# Patient Record
Sex: Female | Born: 1945
Health system: Southern US, Community
[De-identification: ages and names within clinical notes are randomized; demographics above are authoritative.]

## PROBLEM LIST (undated history)

## (undated) DIAGNOSIS — E785 Hyperlipidemia, unspecified: Secondary | ICD-10-CM

## (undated) DIAGNOSIS — I10 Essential (primary) hypertension: Secondary | ICD-10-CM

## (undated) DIAGNOSIS — G459 Transient cerebral ischemic attack, unspecified: Secondary | ICD-10-CM

## (undated) DIAGNOSIS — I2510 Atherosclerotic heart disease of native coronary artery without angina pectoris: Secondary | ICD-10-CM

## (undated) HISTORY — DX: Atherosclerotic heart disease of native coronary artery without angina pectoris: I25.10

## (undated) HISTORY — DX: Hyperlipidemia, unspecified: E78.5

## (undated) HISTORY — DX: Essential (primary) hypertension: I10

## (undated) HISTORY — PX: ABDOMINAL SURGERY: SHX537

## (undated) HISTORY — PX: BREAST CYST ASPIRATION: SHX578

## (undated) HISTORY — DX: Transient cerebral ischemic attack, unspecified: G45.9

---

## 1998-12-01 ENCOUNTER — Encounter: Payer: Self-pay | Admitting: Gynecology

## 1998-12-03 ENCOUNTER — Inpatient Hospital Stay (HOSPITAL_COMMUNITY): Admission: RE | Admit: 1998-12-03 | Discharge: 1998-12-05 | Payer: Self-pay | Admitting: Gynecology

## 1999-08-16 ENCOUNTER — Encounter: Payer: Self-pay | Admitting: Gynecology

## 1999-08-16 ENCOUNTER — Encounter: Admission: RE | Admit: 1999-08-16 | Discharge: 1999-08-16 | Payer: Self-pay | Admitting: Gynecology

## 2000-10-18 ENCOUNTER — Other Ambulatory Visit: Admission: RE | Admit: 2000-10-18 | Discharge: 2000-10-18 | Payer: Self-pay | Admitting: Gynecology

## 2001-01-02 ENCOUNTER — Encounter: Payer: Self-pay | Admitting: Gynecology

## 2001-01-02 ENCOUNTER — Encounter: Admission: RE | Admit: 2001-01-02 | Discharge: 2001-01-02 | Payer: Self-pay | Admitting: Gynecology

## 2001-11-12 ENCOUNTER — Other Ambulatory Visit: Admission: RE | Admit: 2001-11-12 | Discharge: 2001-11-12 | Payer: Self-pay | Admitting: Gynecology

## 2002-12-17 ENCOUNTER — Other Ambulatory Visit: Admission: RE | Admit: 2002-12-17 | Discharge: 2002-12-17 | Payer: Self-pay | Admitting: Gynecology

## 2003-01-02 ENCOUNTER — Encounter: Admission: RE | Admit: 2003-01-02 | Discharge: 2003-01-02 | Payer: Self-pay | Admitting: Gynecology

## 2003-01-02 ENCOUNTER — Encounter: Payer: Self-pay | Admitting: Gynecology

## 2003-12-19 ENCOUNTER — Other Ambulatory Visit: Admission: RE | Admit: 2003-12-19 | Discharge: 2003-12-19 | Payer: Self-pay | Admitting: Gynecology

## 2004-01-05 ENCOUNTER — Encounter: Admission: RE | Admit: 2004-01-05 | Discharge: 2004-01-05 | Payer: Self-pay | Admitting: Gynecology

## 2005-01-05 ENCOUNTER — Encounter: Admission: RE | Admit: 2005-01-05 | Discharge: 2005-01-05 | Payer: Self-pay | Admitting: Gynecology

## 2005-04-22 ENCOUNTER — Other Ambulatory Visit: Admission: RE | Admit: 2005-04-22 | Discharge: 2005-04-22 | Payer: Self-pay | Admitting: Gynecology

## 2005-07-26 ENCOUNTER — Encounter: Admission: RE | Admit: 2005-07-26 | Discharge: 2005-07-26 | Payer: Self-pay | Admitting: Neurology

## 2006-01-09 ENCOUNTER — Encounter: Admission: RE | Admit: 2006-01-09 | Discharge: 2006-01-09 | Payer: Self-pay | Admitting: Gynecology

## 2007-01-15 ENCOUNTER — Encounter: Admission: RE | Admit: 2007-01-15 | Discharge: 2007-01-15 | Payer: Self-pay | Admitting: *Deleted

## 2007-05-09 ENCOUNTER — Other Ambulatory Visit: Admission: RE | Admit: 2007-05-09 | Discharge: 2007-05-09 | Payer: Self-pay | Admitting: Gynecology

## 2008-01-16 ENCOUNTER — Encounter: Admission: RE | Admit: 2008-01-16 | Discharge: 2008-01-16 | Payer: Self-pay | Admitting: Family Medicine

## 2009-01-16 ENCOUNTER — Encounter: Admission: RE | Admit: 2009-01-16 | Discharge: 2009-01-16 | Payer: Self-pay | Admitting: Gynecology

## 2009-06-29 ENCOUNTER — Encounter: Admission: RE | Admit: 2009-06-29 | Discharge: 2009-06-29 | Payer: Self-pay | Admitting: Family Medicine

## 2009-08-05 ENCOUNTER — Encounter (INDEPENDENT_AMBULATORY_CARE_PROVIDER_SITE_OTHER): Payer: Self-pay | Admitting: Interventional Radiology

## 2009-08-05 ENCOUNTER — Other Ambulatory Visit: Admission: RE | Admit: 2009-08-05 | Discharge: 2009-08-05 | Payer: Self-pay | Admitting: Interventional Radiology

## 2009-08-05 ENCOUNTER — Encounter: Admission: RE | Admit: 2009-08-05 | Discharge: 2009-08-05 | Payer: Self-pay | Admitting: General Surgery

## 2009-10-02 ENCOUNTER — Encounter (INDEPENDENT_AMBULATORY_CARE_PROVIDER_SITE_OTHER): Payer: Self-pay | Admitting: General Surgery

## 2009-10-02 ENCOUNTER — Ambulatory Visit (HOSPITAL_COMMUNITY): Admission: RE | Admit: 2009-10-02 | Discharge: 2009-10-03 | Payer: Self-pay | Admitting: General Surgery

## 2010-01-18 ENCOUNTER — Encounter: Admission: RE | Admit: 2010-01-18 | Discharge: 2010-01-18 | Payer: Self-pay | Admitting: Gynecology

## 2010-11-01 ENCOUNTER — Other Ambulatory Visit: Payer: Self-pay | Admitting: Gynecology

## 2010-11-23 IMAGING — US US BIOPSY
1 series · 6 of 6 positions shown · non-contrast
Comparison: none

CLINICAL DATA: Palpable right thyroid dominant mass.

ULTRASOUND-GUIDED THYROID ASPIRATION BIOPSY
TECHNIQUE: Survey ultrasound was performed and the dominant lesion
in the midportion of the right lobe was localized.  An appropriate
skin entry site was determined.  Skin was marked, then prepped with
Betadine, draped in usual sterile fashion, and infiltrated locally
with 1% lidocaine.  Under real-time ultrasound guidance, 4  passes
were made into the lesion with 25 gauge needles.  The patient
tolerated procedure well, with no immediate complications.
IMPRESSION
1.  Technically successful ultrasound-guided thyroid aspiration
biopsy

[Series 1: us biopsy · 0.08mm/px · 6 of 6 slices shown]
[im 1/6]
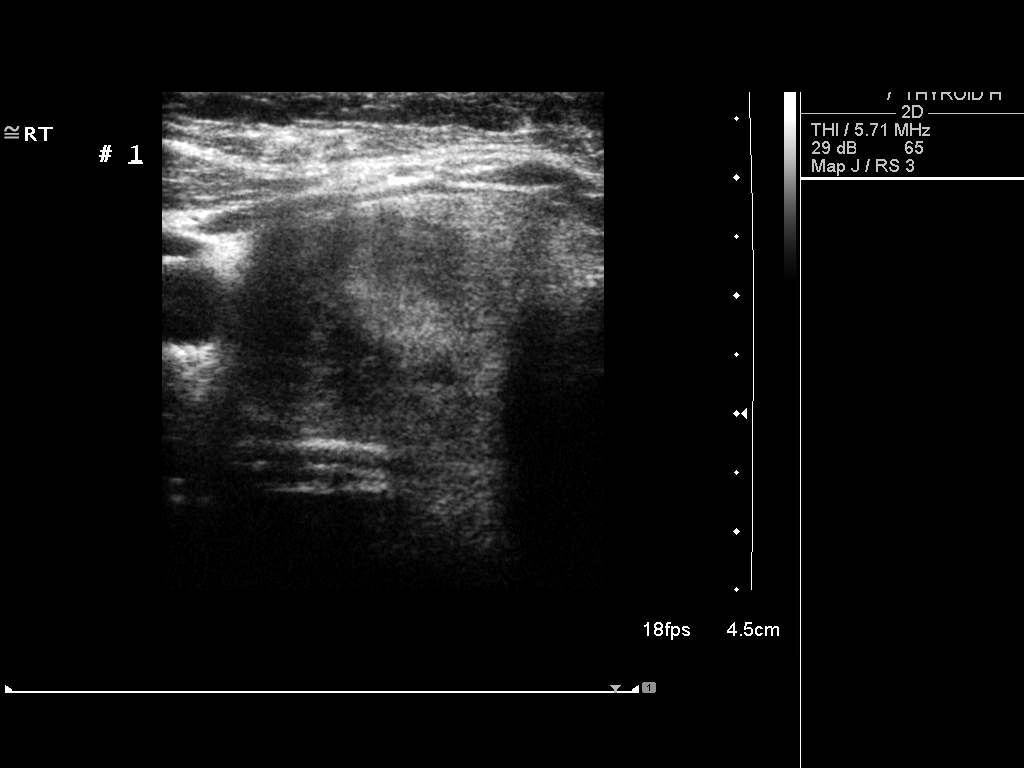
[im 2/6]
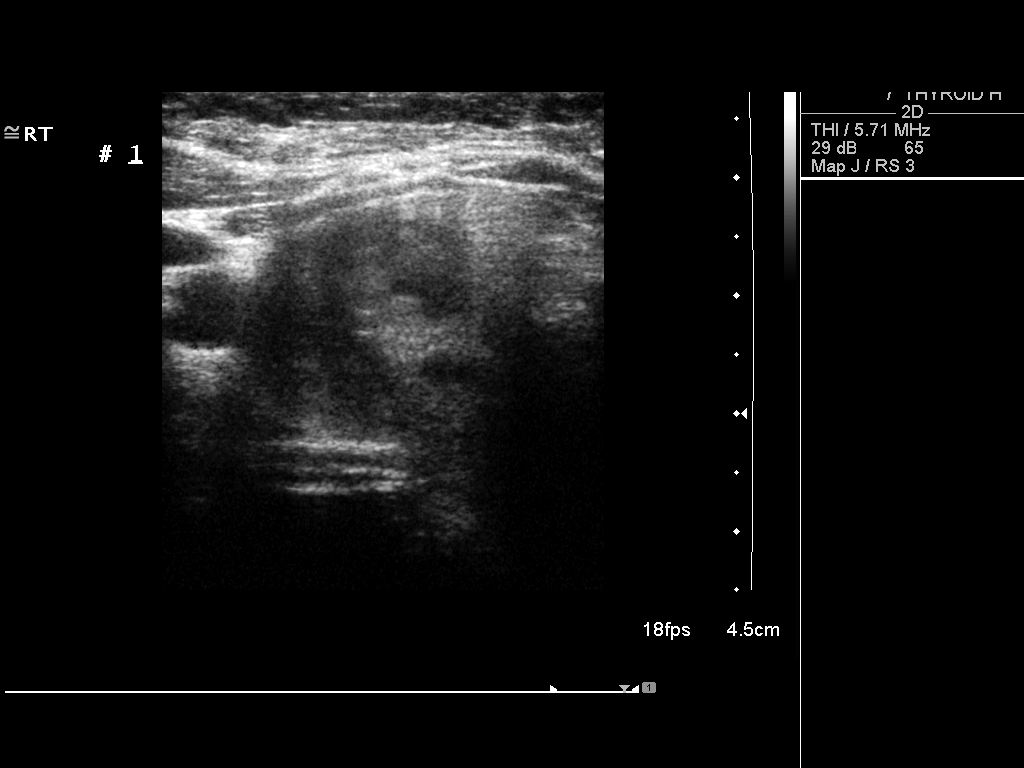
[im 3/6]
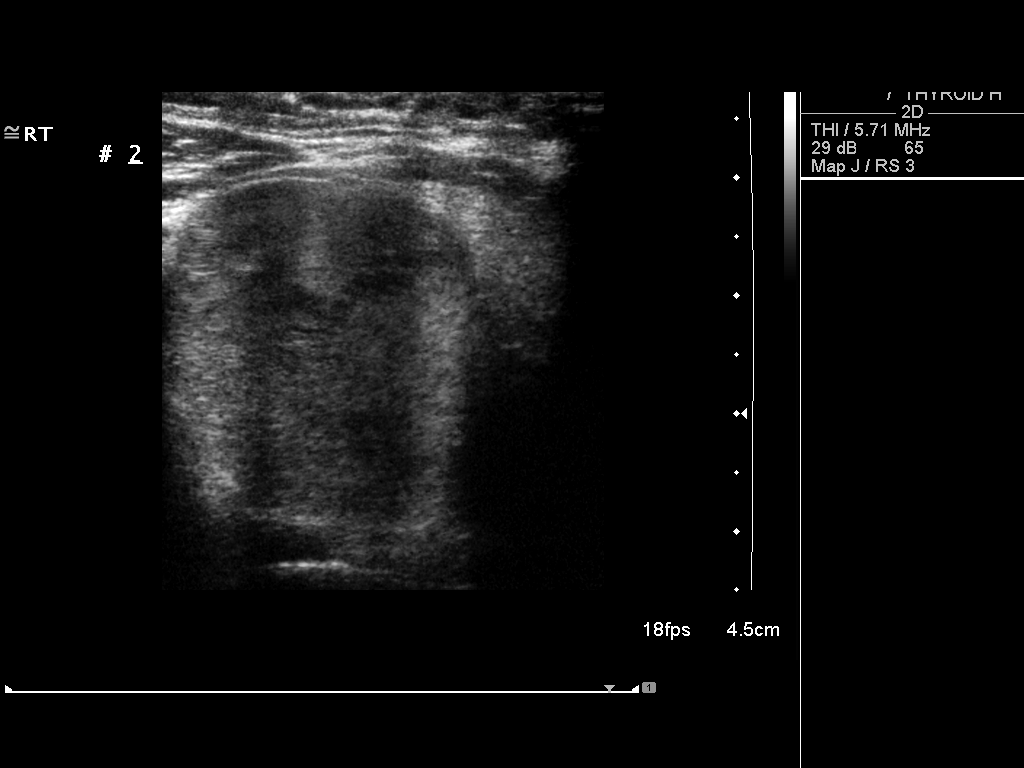
[im 4/6]
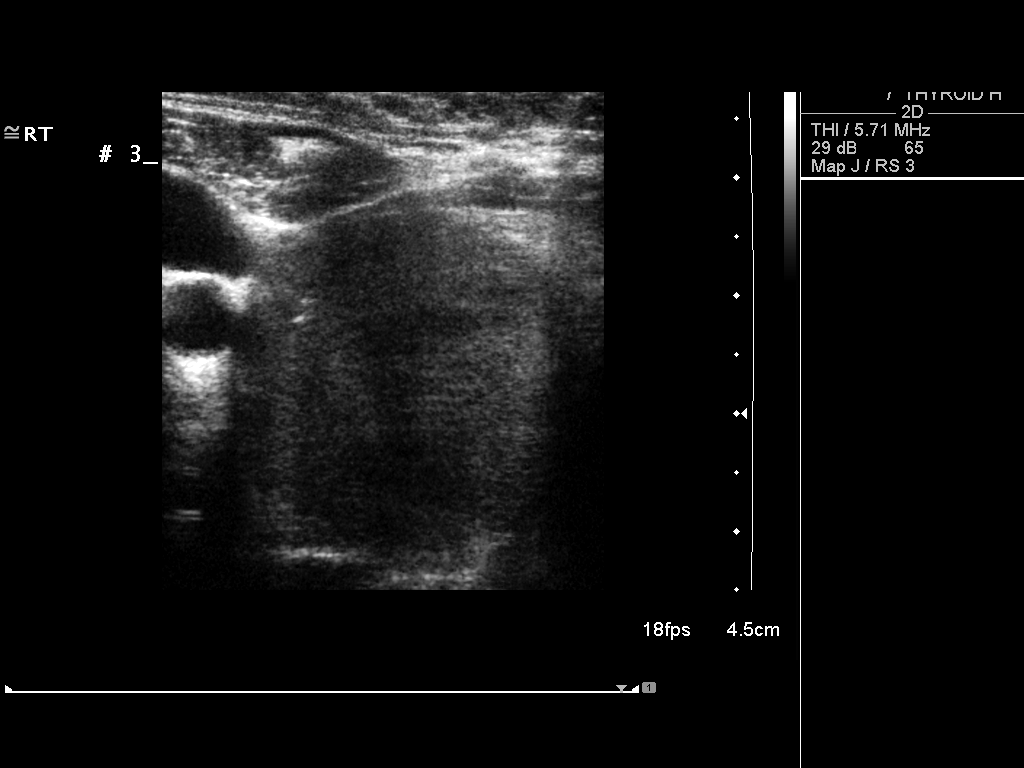
[im 5/6]
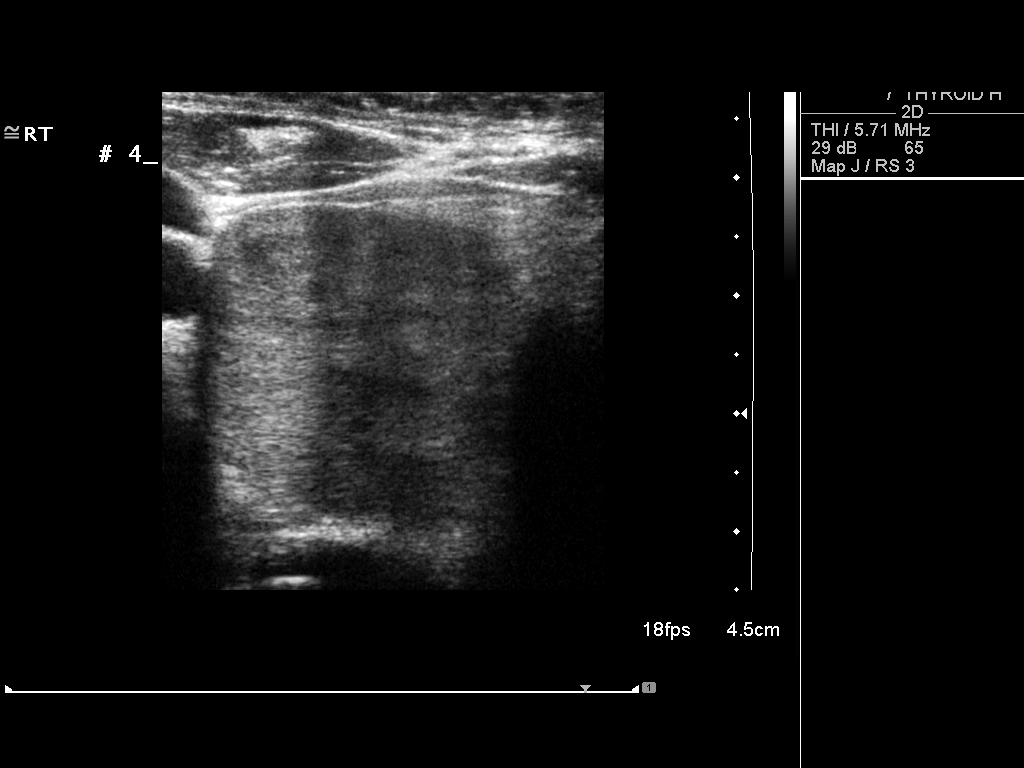
[im 6/6]
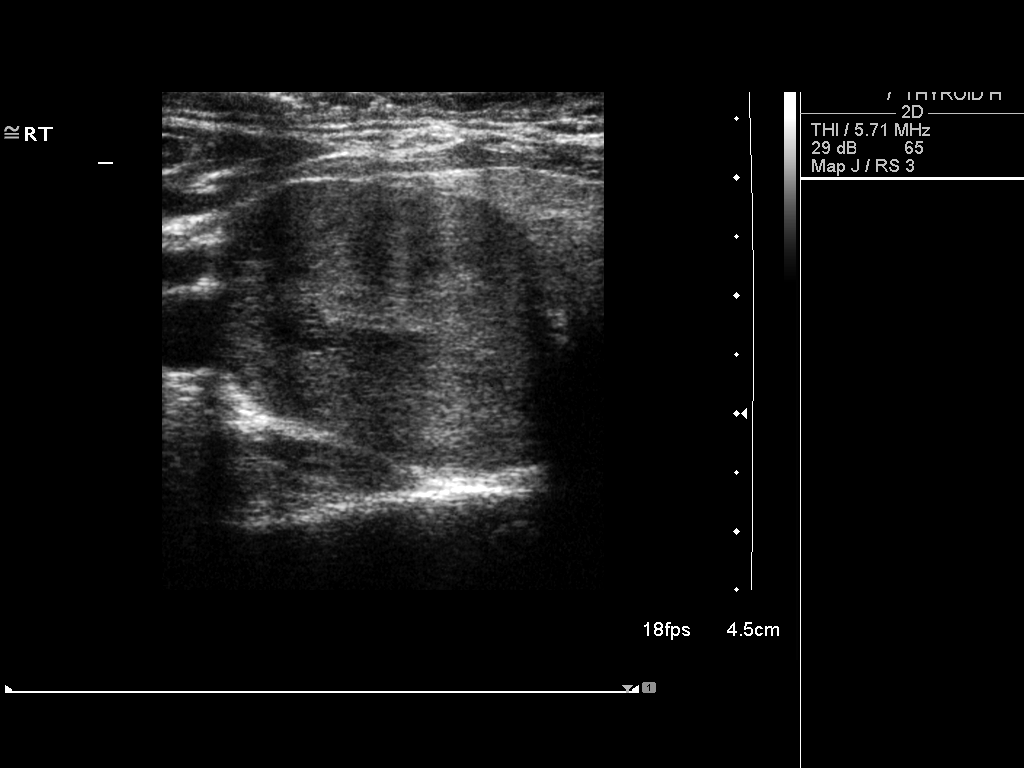

[6 of 6 positions shown; findings below may reference images not displayed]

## 2010-12-21 ENCOUNTER — Other Ambulatory Visit: Payer: Self-pay | Admitting: Family Medicine

## 2010-12-21 DIAGNOSIS — Z1231 Encounter for screening mammogram for malignant neoplasm of breast: Secondary | ICD-10-CM

## 2011-01-18 LAB — URINE MICROSCOPIC-ADD ON

## 2011-01-18 LAB — DIFFERENTIAL
Basophils Absolute: 0 10*3/uL (ref 0.0–0.1)
Basophils Relative: 1 % (ref 0–1)
Eosinophils Absolute: 0.2 10*3/uL (ref 0.0–0.7)
Eosinophils Relative: 4 % (ref 0–5)
Lymphocytes Relative: 36 % (ref 12–46)
Lymphs Abs: 2 10*3/uL (ref 0.7–4.0)
Monocytes Relative: 10 % (ref 3–12)
Neutro Abs: 2.8 10*3/uL (ref 1.7–7.7)

## 2011-01-18 LAB — URINALYSIS, ROUTINE W REFLEX MICROSCOPIC
Ketones, ur: NEGATIVE mg/dL
Protein, ur: NEGATIVE mg/dL

## 2011-01-18 LAB — CBC
RBC: 4.62 MIL/uL (ref 3.87–5.11)
WBC: 5.7 10*3/uL (ref 4.0–10.5)

## 2011-01-18 LAB — COMPREHENSIVE METABOLIC PANEL
Alkaline Phosphatase: 61 U/L (ref 39–117)
BUN: 11 mg/dL (ref 6–23)
Chloride: 105 mEq/L (ref 96–112)
Creatinine, Ser: 0.79 mg/dL (ref 0.4–1.2)
GFR calc Af Amer: >60 mL/min (ref >60)
Glucose, Bld: 97 mg/dL (ref 70–99)
Potassium: 5 mEq/L (ref 3.5–5.1)
Sodium: 141 mEq/L (ref 135–145)

## 2011-01-20 ENCOUNTER — Ambulatory Visit
Admission: RE | Admit: 2011-01-20 | Discharge: 2011-01-20 | Disposition: A | Discharge status: Home / Self Care | Payer: 59 | Source: Ambulatory Visit | Attending: Family Medicine | Admitting: Family Medicine

## 2011-01-20 DIAGNOSIS — Z1231 Encounter for screening mammogram for malignant neoplasm of breast: Secondary | ICD-10-CM

## 2011-02-15 ENCOUNTER — Ambulatory Visit: Payer: 59 | Attending: Family Medicine | Admitting: Physical Therapy

## 2011-02-15 DIAGNOSIS — M25569 Pain in unspecified knee: Secondary | ICD-10-CM | POA: Insufficient documentation

## 2011-02-15 DIAGNOSIS — IMO0001 Reserved for inherently not codable concepts without codable children: Secondary | ICD-10-CM | POA: Insufficient documentation

## 2011-02-15 DIAGNOSIS — M25669 Stiffness of unspecified knee, not elsewhere classified: Secondary | ICD-10-CM | POA: Insufficient documentation

## 2011-03-15 ENCOUNTER — Encounter: Payer: 59 | Admitting: Physical Therapy

## 2011-12-21 ENCOUNTER — Other Ambulatory Visit: Payer: Self-pay | Admitting: Family Medicine

## 2011-12-21 DIAGNOSIS — Z1231 Encounter for screening mammogram for malignant neoplasm of breast: Secondary | ICD-10-CM

## 2012-01-23 ENCOUNTER — Ambulatory Visit
Admission: RE | Admit: 2012-01-23 | Discharge: 2012-01-23 | Disposition: A | Discharge status: Home / Self Care | Payer: 59 | Source: Ambulatory Visit | Attending: Family Medicine | Admitting: Family Medicine

## 2012-01-23 DIAGNOSIS — Z1231 Encounter for screening mammogram for malignant neoplasm of breast: Secondary | ICD-10-CM

## 2012-07-24 ENCOUNTER — Other Ambulatory Visit: Payer: Self-pay | Admitting: Gynecology

## 2012-12-28 DIAGNOSIS — R7301 Impaired fasting glucose: Secondary | ICD-10-CM | POA: Diagnosis not present

## 2012-12-28 DIAGNOSIS — I10 Essential (primary) hypertension: Secondary | ICD-10-CM | POA: Diagnosis not present

## 2012-12-28 DIAGNOSIS — E559 Vitamin D deficiency, unspecified: Secondary | ICD-10-CM | POA: Diagnosis not present

## 2012-12-28 DIAGNOSIS — E039 Hypothyroidism, unspecified: Secondary | ICD-10-CM | POA: Diagnosis not present

## 2012-12-28 DIAGNOSIS — R300 Dysuria: Secondary | ICD-10-CM | POA: Diagnosis not present

## 2012-12-28 DIAGNOSIS — E782 Mixed hyperlipidemia: Secondary | ICD-10-CM | POA: Diagnosis not present

## 2012-12-28 DIAGNOSIS — R7989 Other specified abnormal findings of blood chemistry: Secondary | ICD-10-CM | POA: Diagnosis not present

## 2012-12-28 DIAGNOSIS — N390 Urinary tract infection, site not specified: Secondary | ICD-10-CM | POA: Diagnosis not present

## 2013-01-01 ENCOUNTER — Other Ambulatory Visit: Payer: Self-pay

## 2013-01-01 DIAGNOSIS — Z1231 Encounter for screening mammogram for malignant neoplasm of breast: Secondary | ICD-10-CM

## 2013-01-02 DIAGNOSIS — E039 Hypothyroidism, unspecified: Secondary | ICD-10-CM | POA: Diagnosis not present

## 2013-01-02 DIAGNOSIS — E782 Mixed hyperlipidemia: Secondary | ICD-10-CM | POA: Diagnosis not present

## 2013-01-02 DIAGNOSIS — I10 Essential (primary) hypertension: Secondary | ICD-10-CM | POA: Diagnosis not present

## 2013-01-02 DIAGNOSIS — R7301 Impaired fasting glucose: Secondary | ICD-10-CM | POA: Diagnosis not present

## 2013-01-02 DIAGNOSIS — Z23 Encounter for immunization: Secondary | ICD-10-CM | POA: Diagnosis not present

## 2013-01-02 DIAGNOSIS — E559 Vitamin D deficiency, unspecified: Secondary | ICD-10-CM | POA: Diagnosis not present

## 2013-01-02 DIAGNOSIS — M949 Disorder of cartilage, unspecified: Secondary | ICD-10-CM | POA: Diagnosis not present

## 2013-01-02 DIAGNOSIS — M899 Disorder of bone, unspecified: Secondary | ICD-10-CM | POA: Diagnosis not present

## 2013-02-05 ENCOUNTER — Ambulatory Visit: Payer: 59

## 2013-02-20 ENCOUNTER — Ambulatory Visit
Admission: RE | Admit: 2013-02-20 | Discharge: 2013-02-20 | Disposition: A | Discharge status: Home / Self Care | Payer: Medicare Other | Source: Ambulatory Visit

## 2013-02-20 DIAGNOSIS — Z1231 Encounter for screening mammogram for malignant neoplasm of breast: Secondary | ICD-10-CM

## 2013-07-05 DIAGNOSIS — E039 Hypothyroidism, unspecified: Secondary | ICD-10-CM | POA: Diagnosis not present

## 2013-07-05 DIAGNOSIS — M899 Disorder of bone, unspecified: Secondary | ICD-10-CM | POA: Diagnosis not present

## 2013-07-05 DIAGNOSIS — E559 Vitamin D deficiency, unspecified: Secondary | ICD-10-CM | POA: Diagnosis not present

## 2013-07-05 DIAGNOSIS — I10 Essential (primary) hypertension: Secondary | ICD-10-CM | POA: Diagnosis not present

## 2013-07-05 DIAGNOSIS — R7301 Impaired fasting glucose: Secondary | ICD-10-CM | POA: Diagnosis not present

## 2013-07-05 DIAGNOSIS — E782 Mixed hyperlipidemia: Secondary | ICD-10-CM | POA: Diagnosis not present

## 2013-07-09 DIAGNOSIS — I10 Essential (primary) hypertension: Secondary | ICD-10-CM | POA: Diagnosis not present

## 2013-07-09 DIAGNOSIS — R7301 Impaired fasting glucose: Secondary | ICD-10-CM | POA: Diagnosis not present

## 2013-07-09 DIAGNOSIS — E559 Vitamin D deficiency, unspecified: Secondary | ICD-10-CM | POA: Diagnosis not present

## 2013-07-09 DIAGNOSIS — Z0000 Encounter for general adult medical examination without abnormal findings: Secondary | ICD-10-CM | POA: Diagnosis not present

## 2013-07-09 DIAGNOSIS — N952 Postmenopausal atrophic vaginitis: Secondary | ICD-10-CM | POA: Diagnosis not present

## 2013-07-09 DIAGNOSIS — E782 Mixed hyperlipidemia: Secondary | ICD-10-CM | POA: Diagnosis not present

## 2013-07-09 DIAGNOSIS — E039 Hypothyroidism, unspecified: Secondary | ICD-10-CM | POA: Diagnosis not present

## 2013-07-09 DIAGNOSIS — M899 Disorder of bone, unspecified: Secondary | ICD-10-CM | POA: Diagnosis not present

## 2013-07-13 DIAGNOSIS — F05 Delirium due to known physiological condition: Secondary | ICD-10-CM | POA: Diagnosis not present

## 2013-07-16 ENCOUNTER — Other Ambulatory Visit: Payer: Self-pay | Admitting: Family Medicine

## 2013-07-16 DIAGNOSIS — G459 Transient cerebral ischemic attack, unspecified: Secondary | ICD-10-CM

## 2013-07-17 ENCOUNTER — Ambulatory Visit
Admission: RE | Admit: 2013-07-17 | Discharge: 2013-07-17 | Disposition: A | Discharge status: Home / Self Care | Payer: TRICARE For Life (TFL) | Source: Ambulatory Visit | Attending: Family Medicine | Admitting: Family Medicine

## 2013-07-17 DIAGNOSIS — G459 Transient cerebral ischemic attack, unspecified: Secondary | ICD-10-CM

## 2013-07-17 DIAGNOSIS — R0989 Other specified symptoms and signs involving the circulatory and respiratory systems: Secondary | ICD-10-CM | POA: Diagnosis not present

## 2013-07-26 ENCOUNTER — Other Ambulatory Visit: Payer: Self-pay | Admitting: Family Medicine

## 2013-07-26 DIAGNOSIS — L820 Inflamed seborrheic keratosis: Secondary | ICD-10-CM | POA: Diagnosis not present

## 2013-07-26 DIAGNOSIS — G459 Transient cerebral ischemic attack, unspecified: Secondary | ICD-10-CM | POA: Diagnosis not present

## 2013-07-30 DIAGNOSIS — M899 Disorder of bone, unspecified: Secondary | ICD-10-CM | POA: Diagnosis not present

## 2013-08-14 ENCOUNTER — Other Ambulatory Visit (HOSPITAL_COMMUNITY): Payer: Medicare Other

## 2013-08-20 ENCOUNTER — Other Ambulatory Visit (HOSPITAL_COMMUNITY): Payer: Self-pay | Admitting: Family Medicine

## 2013-08-20 ENCOUNTER — Ambulatory Visit (HOSPITAL_COMMUNITY): Payer: Medicare Other | Attending: Cardiology

## 2013-08-20 ENCOUNTER — Encounter (INDEPENDENT_AMBULATORY_CARE_PROVIDER_SITE_OTHER): Payer: Self-pay

## 2013-08-20 DIAGNOSIS — G459 Transient cerebral ischemic attack, unspecified: Secondary | ICD-10-CM | POA: Insufficient documentation

## 2013-08-20 DIAGNOSIS — I079 Rheumatic tricuspid valve disease, unspecified: Secondary | ICD-10-CM | POA: Diagnosis not present

## 2013-08-20 DIAGNOSIS — I059 Rheumatic mitral valve disease, unspecified: Secondary | ICD-10-CM | POA: Insufficient documentation

## 2013-08-20 DIAGNOSIS — I359 Nonrheumatic aortic valve disorder, unspecified: Secondary | ICD-10-CM | POA: Insufficient documentation

## 2013-08-20 NOTE — Progress Notes (Signed)
Echocardiogram performed.  

## 2013-09-16 DIAGNOSIS — E782 Mixed hyperlipidemia: Secondary | ICD-10-CM | POA: Diagnosis not present

## 2013-09-16 DIAGNOSIS — R7301 Impaired fasting glucose: Secondary | ICD-10-CM | POA: Diagnosis not present

## 2013-09-16 DIAGNOSIS — E559 Vitamin D deficiency, unspecified: Secondary | ICD-10-CM | POA: Diagnosis not present

## 2013-09-16 DIAGNOSIS — M899 Disorder of bone, unspecified: Secondary | ICD-10-CM | POA: Diagnosis not present

## 2013-09-16 DIAGNOSIS — E049 Nontoxic goiter, unspecified: Secondary | ICD-10-CM | POA: Diagnosis not present

## 2013-09-16 DIAGNOSIS — E039 Hypothyroidism, unspecified: Secondary | ICD-10-CM | POA: Diagnosis not present

## 2013-09-30 DIAGNOSIS — I10 Essential (primary) hypertension: Secondary | ICD-10-CM | POA: Diagnosis not present

## 2013-09-30 DIAGNOSIS — E782 Mixed hyperlipidemia: Secondary | ICD-10-CM | POA: Diagnosis not present

## 2013-09-30 DIAGNOSIS — E039 Hypothyroidism, unspecified: Secondary | ICD-10-CM | POA: Diagnosis not present

## 2013-09-30 DIAGNOSIS — G459 Transient cerebral ischemic attack, unspecified: Secondary | ICD-10-CM | POA: Diagnosis not present

## 2014-01-13 DIAGNOSIS — Z0000 Encounter for general adult medical examination without abnormal findings: Secondary | ICD-10-CM | POA: Diagnosis not present

## 2014-01-13 DIAGNOSIS — E039 Hypothyroidism, unspecified: Secondary | ICD-10-CM | POA: Diagnosis not present

## 2014-01-13 DIAGNOSIS — E782 Mixed hyperlipidemia: Secondary | ICD-10-CM | POA: Diagnosis not present

## 2014-01-13 DIAGNOSIS — E559 Vitamin D deficiency, unspecified: Secondary | ICD-10-CM | POA: Diagnosis not present

## 2014-01-13 DIAGNOSIS — I10 Essential (primary) hypertension: Secondary | ICD-10-CM | POA: Diagnosis not present

## 2014-01-13 DIAGNOSIS — G459 Transient cerebral ischemic attack, unspecified: Secondary | ICD-10-CM | POA: Diagnosis not present

## 2014-01-13 DIAGNOSIS — E049 Nontoxic goiter, unspecified: Secondary | ICD-10-CM | POA: Diagnosis not present

## 2014-01-13 DIAGNOSIS — D485 Neoplasm of uncertain behavior of skin: Secondary | ICD-10-CM | POA: Diagnosis not present

## 2014-01-13 DIAGNOSIS — R7301 Impaired fasting glucose: Secondary | ICD-10-CM | POA: Diagnosis not present

## 2014-01-15 DIAGNOSIS — E039 Hypothyroidism, unspecified: Secondary | ICD-10-CM | POA: Diagnosis not present

## 2014-01-15 DIAGNOSIS — M719 Bursopathy, unspecified: Secondary | ICD-10-CM | POA: Diagnosis not present

## 2014-01-15 DIAGNOSIS — E782 Mixed hyperlipidemia: Secondary | ICD-10-CM | POA: Diagnosis not present

## 2014-01-15 DIAGNOSIS — G459 Transient cerebral ischemic attack, unspecified: Secondary | ICD-10-CM | POA: Diagnosis not present

## 2014-01-15 DIAGNOSIS — N952 Postmenopausal atrophic vaginitis: Secondary | ICD-10-CM | POA: Diagnosis not present

## 2014-01-15 DIAGNOSIS — R7301 Impaired fasting glucose: Secondary | ICD-10-CM | POA: Diagnosis not present

## 2014-01-15 DIAGNOSIS — I10 Essential (primary) hypertension: Secondary | ICD-10-CM | POA: Diagnosis not present

## 2014-01-15 DIAGNOSIS — M67919 Unspecified disorder of synovium and tendon, unspecified shoulder: Secondary | ICD-10-CM | POA: Diagnosis not present

## 2014-01-15 DIAGNOSIS — E559 Vitamin D deficiency, unspecified: Secondary | ICD-10-CM | POA: Diagnosis not present

## 2014-01-15 DIAGNOSIS — Z23 Encounter for immunization: Secondary | ICD-10-CM | POA: Diagnosis not present

## 2014-01-16 ENCOUNTER — Ambulatory Visit: Payer: Medicare Other | Attending: Family Medicine

## 2014-01-16 DIAGNOSIS — R5381 Other malaise: Secondary | ICD-10-CM | POA: Diagnosis not present

## 2014-01-16 DIAGNOSIS — M25619 Stiffness of unspecified shoulder, not elsewhere classified: Secondary | ICD-10-CM | POA: Insufficient documentation

## 2014-01-21 ENCOUNTER — Other Ambulatory Visit: Payer: Self-pay

## 2014-01-21 DIAGNOSIS — Z1231 Encounter for screening mammogram for malignant neoplasm of breast: Secondary | ICD-10-CM

## 2014-01-22 ENCOUNTER — Ambulatory Visit: Payer: Medicare Other

## 2014-01-23 ENCOUNTER — Ambulatory Visit: Payer: Medicare Other

## 2014-01-27 ENCOUNTER — Ambulatory Visit: Payer: Medicare Other

## 2014-01-29 ENCOUNTER — Ambulatory Visit: Payer: Medicare Other

## 2014-03-24 ENCOUNTER — Ambulatory Visit
Admission: RE | Admit: 2014-03-24 | Discharge: 2014-03-24 | Disposition: A | Discharge status: Home / Self Care | Payer: Medicare Other | Source: Ambulatory Visit

## 2014-03-24 DIAGNOSIS — Z1231 Encounter for screening mammogram for malignant neoplasm of breast: Secondary | ICD-10-CM | POA: Diagnosis not present

## 2014-03-24 IMAGING — MG MM SCREEN MAMMOGRAM BILATERAL
4 series · 4 of 4 positions shown · non-contrast
Comparison: Previous exam(s).

CLINICAL DATA: Screening.

EXAM:
DIGITAL SCREENING BILATERAL MAMMOGRAM WITH CAD

[R CC]
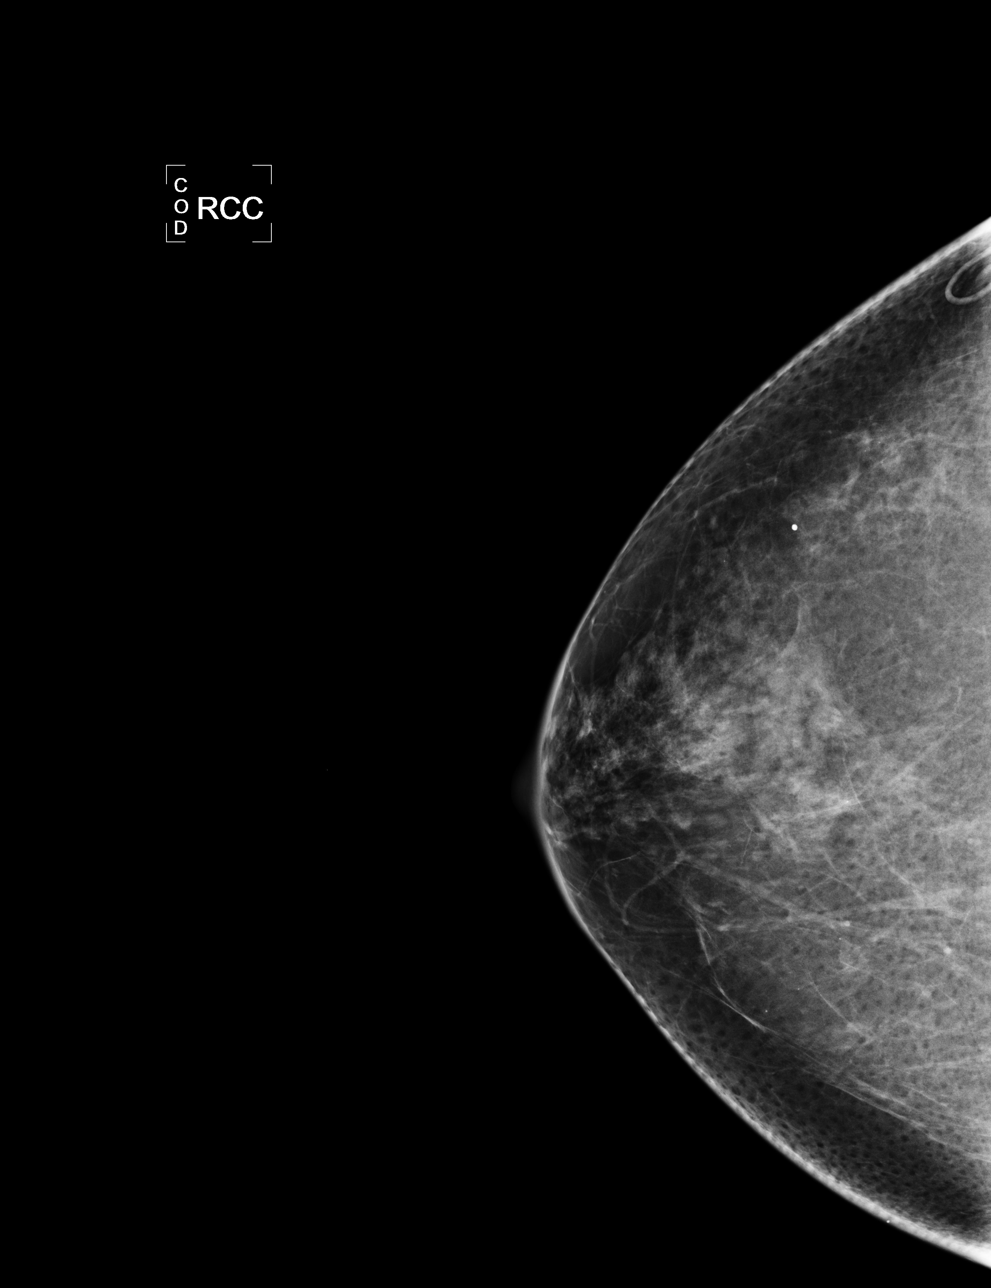

[L CC]
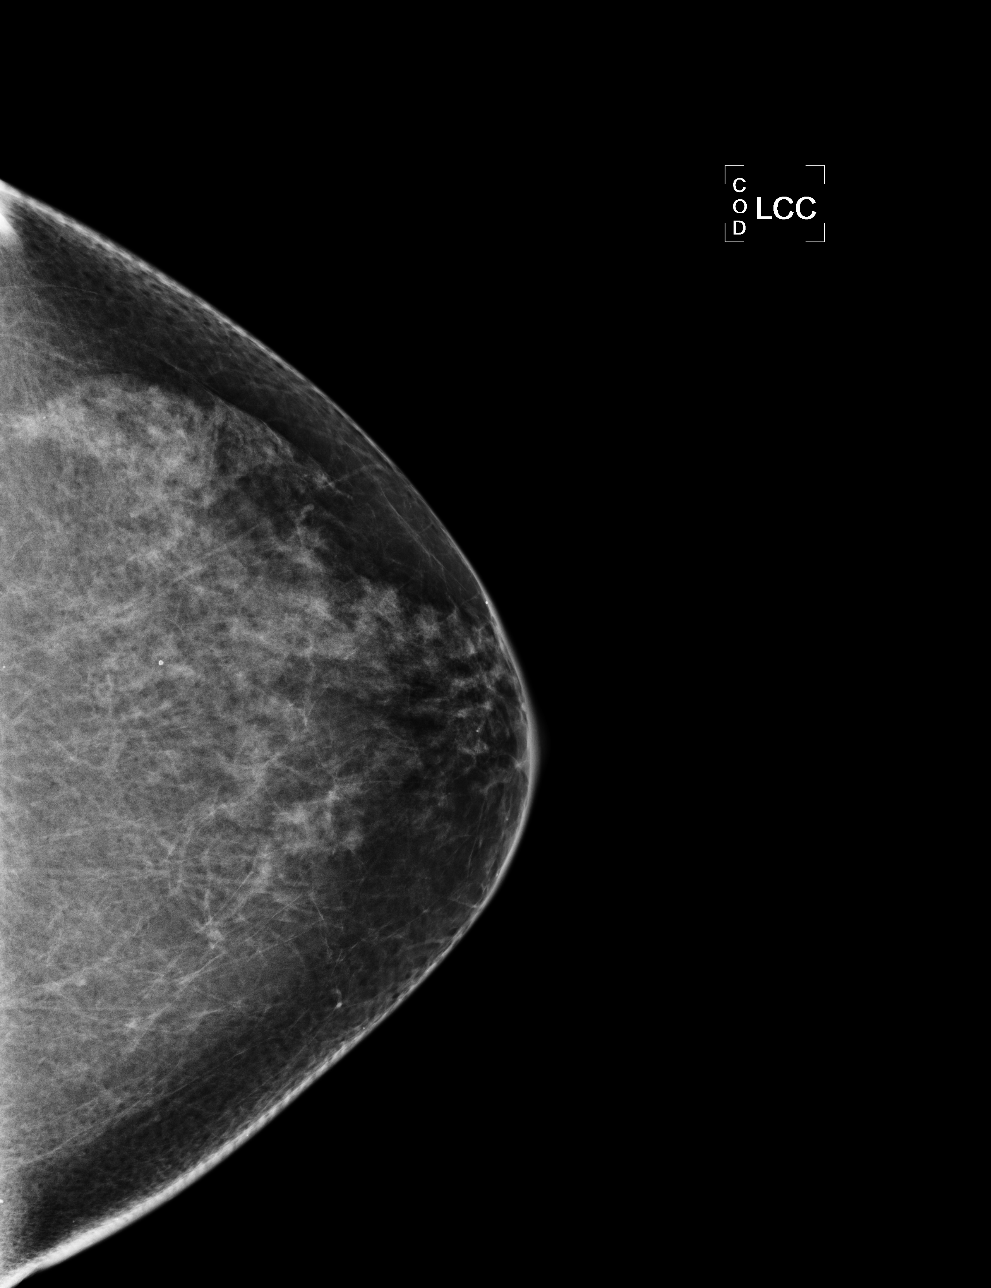

[L MLO]
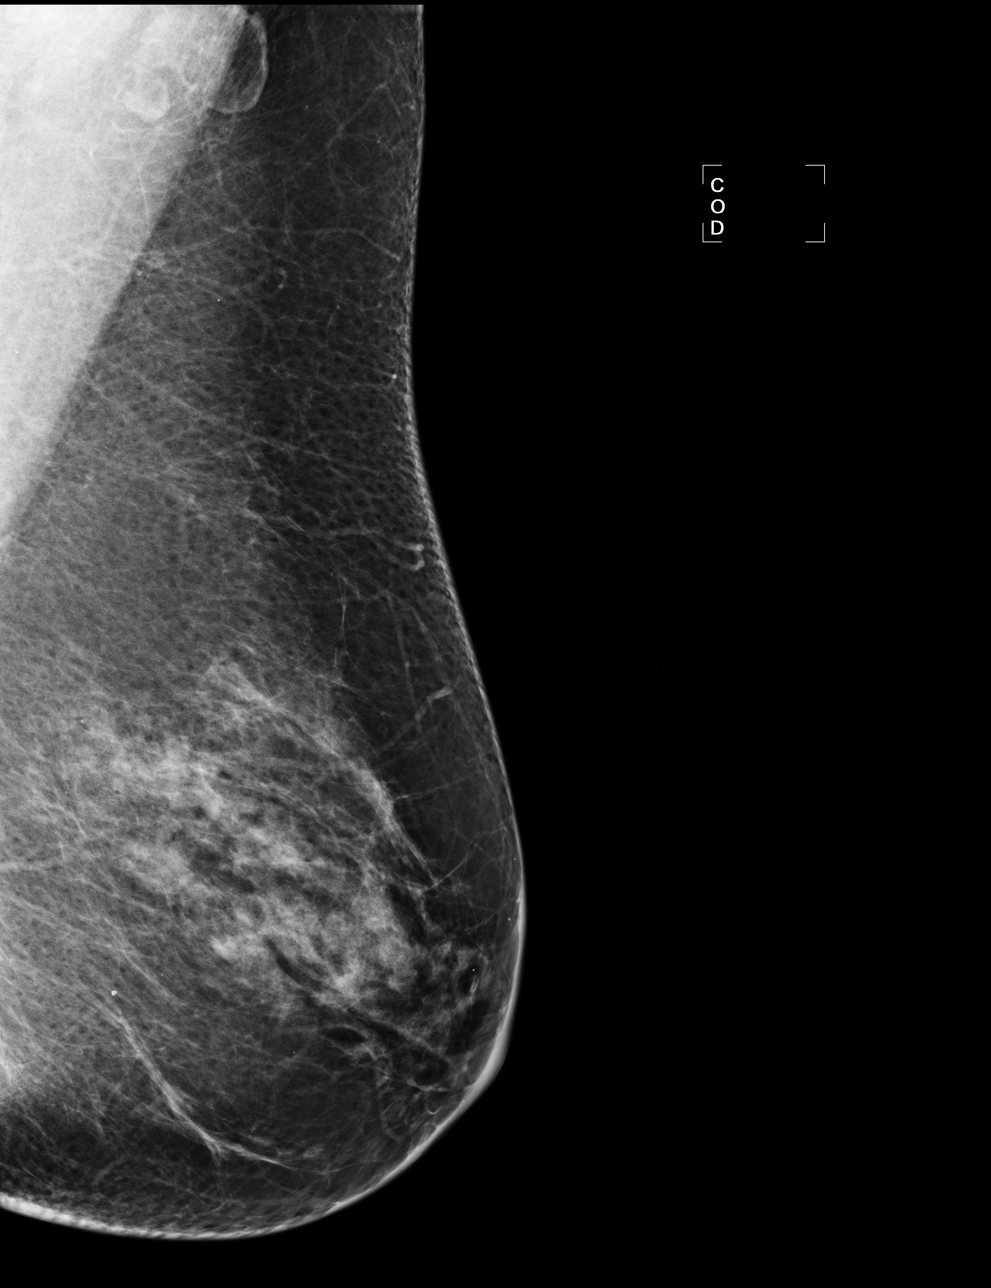

[R MLO]
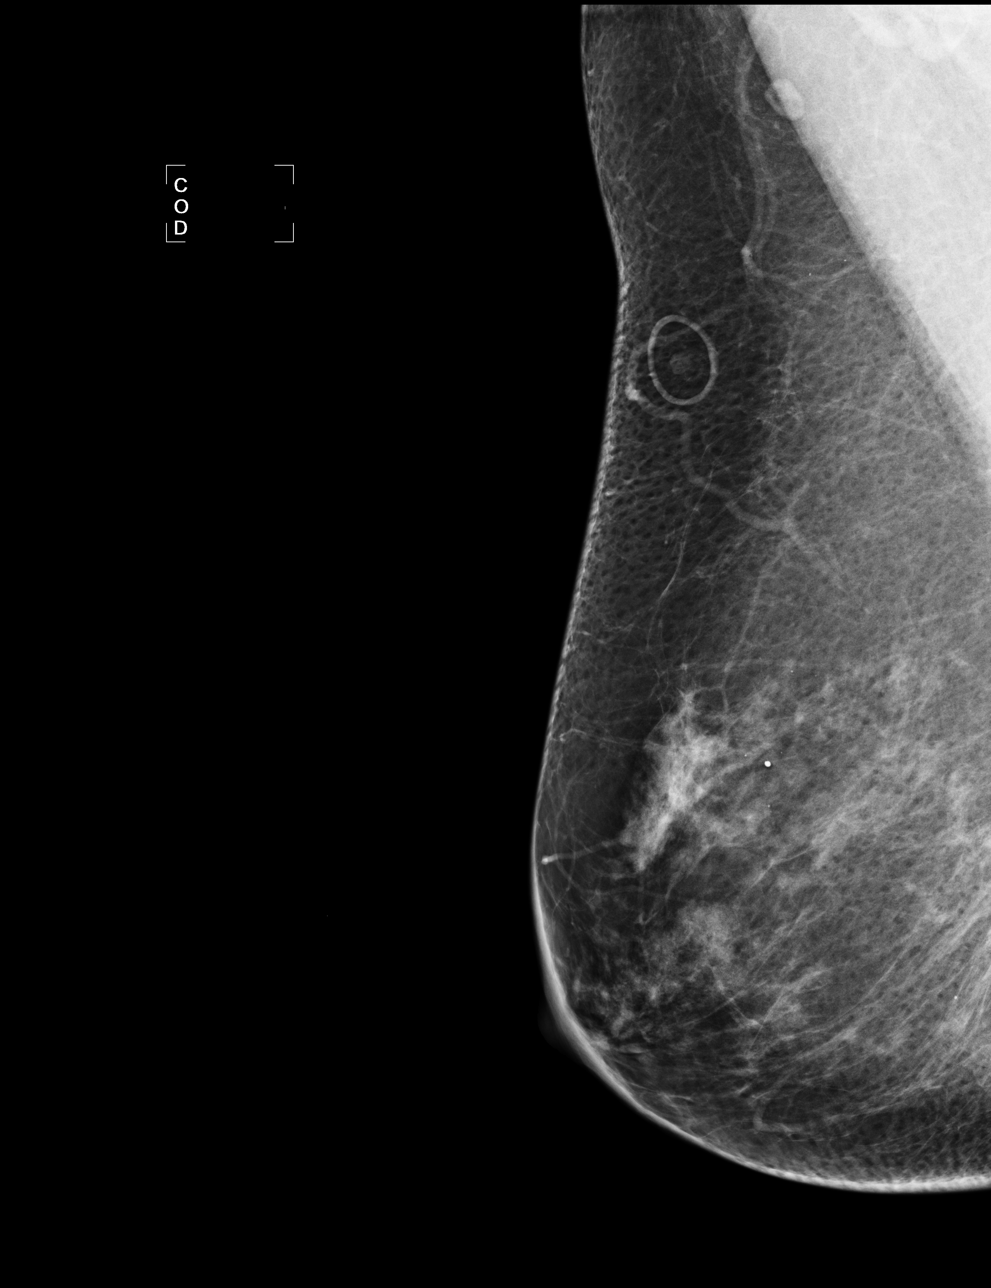

[4 of 4 positions shown; findings below may reference images not displayed]

ACR Breast Density Category b: There are scattered areas of
fibroglandular density.
FINDINGS: There are no findings suspicious for malignancy. Images were
processed with CAD.
IMPRESSION: No mammographic evidence of malignancy. A result letter of this
screening mammogram will be mailed directly to the patient.

RECOMMENDATION:
Screening mammogram in one year. (Code:[US])

BI-RADS CATEGORY  1: Negative.

## 2014-07-01 DIAGNOSIS — Z8673 Personal history of transient ischemic attack (TIA), and cerebral infarction without residual deficits: Secondary | ICD-10-CM | POA: Diagnosis not present

## 2014-07-01 DIAGNOSIS — R059 Cough, unspecified: Secondary | ICD-10-CM | POA: Diagnosis not present

## 2014-07-01 DIAGNOSIS — R05 Cough: Secondary | ICD-10-CM | POA: Diagnosis not present

## 2014-07-01 DIAGNOSIS — R0602 Shortness of breath: Secondary | ICD-10-CM | POA: Diagnosis not present

## 2014-07-01 DIAGNOSIS — Z87891 Personal history of nicotine dependence: Secondary | ICD-10-CM | POA: Diagnosis not present

## 2014-07-01 DIAGNOSIS — Z882 Allergy status to sulfonamides status: Secondary | ICD-10-CM | POA: Diagnosis not present

## 2014-07-01 DIAGNOSIS — E039 Hypothyroidism, unspecified: Secondary | ICD-10-CM | POA: Diagnosis not present

## 2014-07-01 DIAGNOSIS — Z7982 Long term (current) use of aspirin: Secondary | ICD-10-CM | POA: Diagnosis not present

## 2014-07-01 DIAGNOSIS — I10 Essential (primary) hypertension: Secondary | ICD-10-CM | POA: Diagnosis not present

## 2014-07-01 DIAGNOSIS — R0989 Other specified symptoms and signs involving the circulatory and respiratory systems: Secondary | ICD-10-CM | POA: Diagnosis not present

## 2014-07-01 DIAGNOSIS — Z79899 Other long term (current) drug therapy: Secondary | ICD-10-CM | POA: Diagnosis not present

## 2014-07-01 DIAGNOSIS — Z91040 Latex allergy status: Secondary | ICD-10-CM | POA: Diagnosis not present

## 2014-07-16 DIAGNOSIS — E559 Vitamin D deficiency, unspecified: Secondary | ICD-10-CM | POA: Diagnosis not present

## 2014-07-16 DIAGNOSIS — Z1331 Encounter for screening for depression: Secondary | ICD-10-CM | POA: Diagnosis not present

## 2014-07-16 DIAGNOSIS — E782 Mixed hyperlipidemia: Secondary | ICD-10-CM | POA: Diagnosis not present

## 2014-07-16 DIAGNOSIS — E039 Hypothyroidism, unspecified: Secondary | ICD-10-CM | POA: Diagnosis not present

## 2014-07-16 DIAGNOSIS — N952 Postmenopausal atrophic vaginitis: Secondary | ICD-10-CM | POA: Diagnosis not present

## 2014-07-16 DIAGNOSIS — I10 Essential (primary) hypertension: Secondary | ICD-10-CM | POA: Diagnosis not present

## 2014-07-16 DIAGNOSIS — Z01419 Encounter for gynecological examination (general) (routine) without abnormal findings: Secondary | ICD-10-CM | POA: Diagnosis not present

## 2014-07-16 DIAGNOSIS — Z0000 Encounter for general adult medical examination without abnormal findings: Secondary | ICD-10-CM | POA: Diagnosis not present

## 2014-07-16 DIAGNOSIS — Z23 Encounter for immunization: Secondary | ICD-10-CM | POA: Diagnosis not present

## 2014-07-16 DIAGNOSIS — R7301 Impaired fasting glucose: Secondary | ICD-10-CM | POA: Diagnosis not present

## 2014-08-08 DIAGNOSIS — R195 Other fecal abnormalities: Secondary | ICD-10-CM | POA: Diagnosis not present

## 2014-09-03 DIAGNOSIS — K5900 Constipation, unspecified: Secondary | ICD-10-CM | POA: Diagnosis not present

## 2014-09-03 DIAGNOSIS — K921 Melena: Secondary | ICD-10-CM | POA: Diagnosis not present

## 2014-09-04 ENCOUNTER — Other Ambulatory Visit: Payer: Self-pay | Admitting: Family Medicine

## 2014-09-04 DIAGNOSIS — M25512 Pain in left shoulder: Secondary | ICD-10-CM | POA: Diagnosis not present

## 2014-09-04 DIAGNOSIS — N952 Postmenopausal atrophic vaginitis: Secondary | ICD-10-CM | POA: Diagnosis not present

## 2014-09-17 ENCOUNTER — Other Ambulatory Visit: Payer: Medicare Other

## 2014-09-20 DIAGNOSIS — J069 Acute upper respiratory infection, unspecified: Secondary | ICD-10-CM | POA: Diagnosis not present

## 2014-10-02 ENCOUNTER — Other Ambulatory Visit: Payer: Medicare Other

## 2014-10-23 DIAGNOSIS — R195 Other fecal abnormalities: Secondary | ICD-10-CM | POA: Diagnosis not present

## 2014-10-23 DIAGNOSIS — K648 Other hemorrhoids: Secondary | ICD-10-CM | POA: Diagnosis not present

## 2015-01-22 DIAGNOSIS — E559 Vitamin D deficiency, unspecified: Secondary | ICD-10-CM | POA: Diagnosis not present

## 2015-01-22 DIAGNOSIS — K921 Melena: Secondary | ICD-10-CM | POA: Diagnosis not present

## 2015-01-22 DIAGNOSIS — M899 Disorder of bone, unspecified: Secondary | ICD-10-CM | POA: Diagnosis not present

## 2015-01-22 DIAGNOSIS — E782 Mixed hyperlipidemia: Secondary | ICD-10-CM | POA: Diagnosis not present

## 2015-01-22 DIAGNOSIS — I10 Essential (primary) hypertension: Secondary | ICD-10-CM | POA: Diagnosis not present

## 2015-01-22 DIAGNOSIS — E039 Hypothyroidism, unspecified: Secondary | ICD-10-CM | POA: Diagnosis not present

## 2015-01-22 DIAGNOSIS — N952 Postmenopausal atrophic vaginitis: Secondary | ICD-10-CM | POA: Diagnosis not present

## 2015-01-22 DIAGNOSIS — Z1211 Encounter for screening for malignant neoplasm of colon: Secondary | ICD-10-CM | POA: Diagnosis not present

## 2015-01-22 DIAGNOSIS — R7301 Impaired fasting glucose: Secondary | ICD-10-CM | POA: Diagnosis not present

## 2015-01-22 DIAGNOSIS — G459 Transient cerebral ischemic attack, unspecified: Secondary | ICD-10-CM | POA: Diagnosis not present

## 2015-01-23 DIAGNOSIS — Z8673 Personal history of transient ischemic attack (TIA), and cerebral infarction without residual deficits: Secondary | ICD-10-CM | POA: Diagnosis not present

## 2015-01-23 DIAGNOSIS — E039 Hypothyroidism, unspecified: Secondary | ICD-10-CM | POA: Diagnosis not present

## 2015-01-23 DIAGNOSIS — N952 Postmenopausal atrophic vaginitis: Secondary | ICD-10-CM | POA: Diagnosis not present

## 2015-01-23 DIAGNOSIS — M899 Disorder of bone, unspecified: Secondary | ICD-10-CM | POA: Diagnosis not present

## 2015-01-23 DIAGNOSIS — I10 Essential (primary) hypertension: Secondary | ICD-10-CM | POA: Diagnosis not present

## 2015-01-23 DIAGNOSIS — E782 Mixed hyperlipidemia: Secondary | ICD-10-CM | POA: Diagnosis not present

## 2015-01-23 DIAGNOSIS — E559 Vitamin D deficiency, unspecified: Secondary | ICD-10-CM | POA: Diagnosis not present

## 2015-01-23 DIAGNOSIS — R7301 Impaired fasting glucose: Secondary | ICD-10-CM | POA: Diagnosis not present

## 2015-02-13 ENCOUNTER — Other Ambulatory Visit: Payer: Self-pay

## 2015-02-13 DIAGNOSIS — Z1231 Encounter for screening mammogram for malignant neoplasm of breast: Secondary | ICD-10-CM

## 2015-03-04 DIAGNOSIS — J209 Acute bronchitis, unspecified: Secondary | ICD-10-CM | POA: Diagnosis not present

## 2015-03-04 DIAGNOSIS — H6692 Otitis media, unspecified, left ear: Secondary | ICD-10-CM | POA: Diagnosis not present

## 2015-04-02 ENCOUNTER — Ambulatory Visit: Payer: Medicare Other

## 2015-04-09 ENCOUNTER — Ambulatory Visit
Admission: RE | Admit: 2015-04-09 | Discharge: 2015-04-09 | Disposition: A | Discharge status: Home / Self Care | Payer: Medicare Other | Source: Ambulatory Visit

## 2015-04-09 DIAGNOSIS — Z1231 Encounter for screening mammogram for malignant neoplasm of breast: Secondary | ICD-10-CM | POA: Diagnosis not present

## 2015-08-11 DIAGNOSIS — E559 Vitamin D deficiency, unspecified: Secondary | ICD-10-CM | POA: Diagnosis not present

## 2015-08-11 DIAGNOSIS — I10 Essential (primary) hypertension: Secondary | ICD-10-CM | POA: Diagnosis not present

## 2015-08-11 DIAGNOSIS — E039 Hypothyroidism, unspecified: Secondary | ICD-10-CM | POA: Diagnosis not present

## 2015-08-11 DIAGNOSIS — E782 Mixed hyperlipidemia: Secondary | ICD-10-CM | POA: Diagnosis not present

## 2015-08-11 DIAGNOSIS — R7301 Impaired fasting glucose: Secondary | ICD-10-CM | POA: Diagnosis not present

## 2015-08-12 DIAGNOSIS — R7301 Impaired fasting glucose: Secondary | ICD-10-CM | POA: Diagnosis not present

## 2015-08-12 DIAGNOSIS — E039 Hypothyroidism, unspecified: Secondary | ICD-10-CM | POA: Diagnosis not present

## 2015-08-12 DIAGNOSIS — I10 Essential (primary) hypertension: Secondary | ICD-10-CM | POA: Diagnosis not present

## 2015-08-12 DIAGNOSIS — E559 Vitamin D deficiency, unspecified: Secondary | ICD-10-CM | POA: Diagnosis not present

## 2015-08-12 DIAGNOSIS — N952 Postmenopausal atrophic vaginitis: Secondary | ICD-10-CM | POA: Diagnosis not present

## 2015-08-12 DIAGNOSIS — Z8673 Personal history of transient ischemic attack (TIA), and cerebral infarction without residual deficits: Secondary | ICD-10-CM | POA: Diagnosis not present

## 2015-08-12 DIAGNOSIS — M85851 Other specified disorders of bone density and structure, right thigh: Secondary | ICD-10-CM | POA: Diagnosis not present

## 2015-08-12 DIAGNOSIS — Z23 Encounter for immunization: Secondary | ICD-10-CM | POA: Diagnosis not present

## 2015-08-12 DIAGNOSIS — E782 Mixed hyperlipidemia: Secondary | ICD-10-CM | POA: Diagnosis not present

## 2015-09-22 DIAGNOSIS — M859 Disorder of bone density and structure, unspecified: Secondary | ICD-10-CM | POA: Diagnosis not present

## 2015-09-22 DIAGNOSIS — M8589 Other specified disorders of bone density and structure, multiple sites: Secondary | ICD-10-CM | POA: Diagnosis not present

## 2015-11-06 DIAGNOSIS — E559 Vitamin D deficiency, unspecified: Secondary | ICD-10-CM | POA: Diagnosis not present

## 2015-11-06 DIAGNOSIS — M85851 Other specified disorders of bone density and structure, right thigh: Secondary | ICD-10-CM | POA: Diagnosis not present

## 2015-11-06 DIAGNOSIS — Z23 Encounter for immunization: Secondary | ICD-10-CM | POA: Diagnosis not present

## 2015-11-06 DIAGNOSIS — E782 Mixed hyperlipidemia: Secondary | ICD-10-CM | POA: Diagnosis not present

## 2015-11-06 DIAGNOSIS — Z8673 Personal history of transient ischemic attack (TIA), and cerebral infarction without residual deficits: Secondary | ICD-10-CM | POA: Diagnosis not present

## 2015-11-06 DIAGNOSIS — I10 Essential (primary) hypertension: Secondary | ICD-10-CM | POA: Diagnosis not present

## 2015-11-06 DIAGNOSIS — E039 Hypothyroidism, unspecified: Secondary | ICD-10-CM | POA: Diagnosis not present

## 2015-11-06 DIAGNOSIS — R7301 Impaired fasting glucose: Secondary | ICD-10-CM | POA: Diagnosis not present

## 2015-11-06 DIAGNOSIS — N952 Postmenopausal atrophic vaginitis: Secondary | ICD-10-CM | POA: Diagnosis not present

## 2015-11-30 DIAGNOSIS — J029 Acute pharyngitis, unspecified: Secondary | ICD-10-CM | POA: Diagnosis not present

## 2016-02-08 DIAGNOSIS — N952 Postmenopausal atrophic vaginitis: Secondary | ICD-10-CM | POA: Diagnosis not present

## 2016-02-08 DIAGNOSIS — R7301 Impaired fasting glucose: Secondary | ICD-10-CM | POA: Diagnosis not present

## 2016-02-08 DIAGNOSIS — I10 Essential (primary) hypertension: Secondary | ICD-10-CM | POA: Diagnosis not present

## 2016-02-08 DIAGNOSIS — Z23 Encounter for immunization: Secondary | ICD-10-CM | POA: Diagnosis not present

## 2016-02-08 DIAGNOSIS — M85851 Other specified disorders of bone density and structure, right thigh: Secondary | ICD-10-CM | POA: Diagnosis not present

## 2016-02-08 DIAGNOSIS — E782 Mixed hyperlipidemia: Secondary | ICD-10-CM | POA: Diagnosis not present

## 2016-02-08 DIAGNOSIS — E039 Hypothyroidism, unspecified: Secondary | ICD-10-CM | POA: Diagnosis not present

## 2016-02-08 DIAGNOSIS — E559 Vitamin D deficiency, unspecified: Secondary | ICD-10-CM | POA: Diagnosis not present

## 2016-02-08 DIAGNOSIS — Z8673 Personal history of transient ischemic attack (TIA), and cerebral infarction without residual deficits: Secondary | ICD-10-CM | POA: Diagnosis not present

## 2016-02-08 DIAGNOSIS — J029 Acute pharyngitis, unspecified: Secondary | ICD-10-CM | POA: Diagnosis not present

## 2016-02-11 DIAGNOSIS — R7301 Impaired fasting glucose: Secondary | ICD-10-CM | POA: Diagnosis not present

## 2016-02-11 DIAGNOSIS — R300 Dysuria: Secondary | ICD-10-CM | POA: Diagnosis not present

## 2016-02-11 DIAGNOSIS — R5383 Other fatigue: Secondary | ICD-10-CM | POA: Diagnosis not present

## 2016-02-11 DIAGNOSIS — E782 Mixed hyperlipidemia: Secondary | ICD-10-CM | POA: Diagnosis not present

## 2016-02-11 DIAGNOSIS — I10 Essential (primary) hypertension: Secondary | ICD-10-CM | POA: Diagnosis not present

## 2016-02-11 DIAGNOSIS — E039 Hypothyroidism, unspecified: Secondary | ICD-10-CM | POA: Diagnosis not present

## 2016-02-11 DIAGNOSIS — Z1389 Encounter for screening for other disorder: Secondary | ICD-10-CM | POA: Diagnosis not present

## 2016-02-26 ENCOUNTER — Other Ambulatory Visit: Payer: Self-pay

## 2016-02-26 DIAGNOSIS — Z1231 Encounter for screening mammogram for malignant neoplasm of breast: Secondary | ICD-10-CM

## 2016-04-01 DIAGNOSIS — R300 Dysuria: Secondary | ICD-10-CM | POA: Diagnosis not present

## 2016-04-01 DIAGNOSIS — I10 Essential (primary) hypertension: Secondary | ICD-10-CM | POA: Diagnosis not present

## 2016-04-01 DIAGNOSIS — E782 Mixed hyperlipidemia: Secondary | ICD-10-CM | POA: Diagnosis not present

## 2016-04-01 DIAGNOSIS — R7301 Impaired fasting glucose: Secondary | ICD-10-CM | POA: Diagnosis not present

## 2016-04-01 DIAGNOSIS — R5383 Other fatigue: Secondary | ICD-10-CM | POA: Diagnosis not present

## 2016-04-01 DIAGNOSIS — E039 Hypothyroidism, unspecified: Secondary | ICD-10-CM | POA: Diagnosis not present

## 2016-04-01 DIAGNOSIS — Z1389 Encounter for screening for other disorder: Secondary | ICD-10-CM | POA: Diagnosis not present

## 2016-04-12 ENCOUNTER — Ambulatory Visit: Payer: Medicare Other

## 2016-04-14 ENCOUNTER — Other Ambulatory Visit: Payer: Self-pay | Admitting: Family Medicine

## 2016-04-14 ENCOUNTER — Ambulatory Visit
Admission: RE | Admit: 2016-04-14 | Discharge: 2016-04-14 | Disposition: A | Discharge status: Home / Self Care | Payer: Medicare Other | Source: Ambulatory Visit

## 2016-04-14 DIAGNOSIS — Z1231 Encounter for screening mammogram for malignant neoplasm of breast: Secondary | ICD-10-CM | POA: Diagnosis not present

## 2016-07-14 DIAGNOSIS — L509 Urticaria, unspecified: Secondary | ICD-10-CM | POA: Diagnosis not present

## 2016-08-17 DIAGNOSIS — J029 Acute pharyngitis, unspecified: Secondary | ICD-10-CM | POA: Diagnosis not present

## 2016-08-17 DIAGNOSIS — R300 Dysuria: Secondary | ICD-10-CM | POA: Diagnosis not present

## 2016-08-17 DIAGNOSIS — E039 Hypothyroidism, unspecified: Secondary | ICD-10-CM | POA: Diagnosis not present

## 2016-08-17 DIAGNOSIS — Z23 Encounter for immunization: Secondary | ICD-10-CM | POA: Diagnosis not present

## 2016-08-17 DIAGNOSIS — M85851 Other specified disorders of bone density and structure, right thigh: Secondary | ICD-10-CM | POA: Diagnosis not present

## 2016-08-17 DIAGNOSIS — I10 Essential (primary) hypertension: Secondary | ICD-10-CM | POA: Diagnosis not present

## 2016-08-17 DIAGNOSIS — R7301 Impaired fasting glucose: Secondary | ICD-10-CM | POA: Diagnosis not present

## 2016-08-17 DIAGNOSIS — N952 Postmenopausal atrophic vaginitis: Secondary | ICD-10-CM | POA: Diagnosis not present

## 2016-08-17 DIAGNOSIS — R5383 Other fatigue: Secondary | ICD-10-CM | POA: Diagnosis not present

## 2016-08-17 DIAGNOSIS — E782 Mixed hyperlipidemia: Secondary | ICD-10-CM | POA: Diagnosis not present

## 2016-08-17 DIAGNOSIS — E559 Vitamin D deficiency, unspecified: Secondary | ICD-10-CM | POA: Diagnosis not present

## 2016-08-17 DIAGNOSIS — Z1389 Encounter for screening for other disorder: Secondary | ICD-10-CM | POA: Diagnosis not present

## 2016-08-18 DIAGNOSIS — N952 Postmenopausal atrophic vaginitis: Secondary | ICD-10-CM | POA: Diagnosis not present

## 2016-08-18 DIAGNOSIS — E039 Hypothyroidism, unspecified: Secondary | ICD-10-CM | POA: Diagnosis not present

## 2016-08-18 DIAGNOSIS — E559 Vitamin D deficiency, unspecified: Secondary | ICD-10-CM | POA: Diagnosis not present

## 2016-08-18 DIAGNOSIS — Z0000 Encounter for general adult medical examination without abnormal findings: Secondary | ICD-10-CM | POA: Diagnosis not present

## 2016-08-18 DIAGNOSIS — Z23 Encounter for immunization: Secondary | ICD-10-CM | POA: Diagnosis not present

## 2016-08-18 DIAGNOSIS — M19041 Primary osteoarthritis, right hand: Secondary | ICD-10-CM | POA: Diagnosis not present

## 2016-08-18 DIAGNOSIS — M19042 Primary osteoarthritis, left hand: Secondary | ICD-10-CM | POA: Diagnosis not present

## 2016-08-18 DIAGNOSIS — Z01419 Encounter for gynecological examination (general) (routine) without abnormal findings: Secondary | ICD-10-CM | POA: Diagnosis not present

## 2016-08-18 DIAGNOSIS — Z1389 Encounter for screening for other disorder: Secondary | ICD-10-CM | POA: Diagnosis not present

## 2016-08-18 DIAGNOSIS — I10 Essential (primary) hypertension: Secondary | ICD-10-CM | POA: Diagnosis not present

## 2016-08-18 DIAGNOSIS — E782 Mixed hyperlipidemia: Secondary | ICD-10-CM | POA: Diagnosis not present

## 2016-08-18 DIAGNOSIS — R7301 Impaired fasting glucose: Secondary | ICD-10-CM | POA: Diagnosis not present

## 2016-09-14 DIAGNOSIS — L299 Pruritus, unspecified: Secondary | ICD-10-CM | POA: Diagnosis not present

## 2016-09-14 DIAGNOSIS — T783XXA Angioneurotic edema, initial encounter: Secondary | ICD-10-CM | POA: Diagnosis not present

## 2016-12-13 DIAGNOSIS — L570 Actinic keratosis: Secondary | ICD-10-CM | POA: Diagnosis not present

## 2016-12-13 DIAGNOSIS — L661 Lichen planopilaris: Secondary | ICD-10-CM | POA: Diagnosis not present

## 2016-12-13 DIAGNOSIS — L219 Seborrheic dermatitis, unspecified: Secondary | ICD-10-CM | POA: Diagnosis not present

## 2016-12-13 DIAGNOSIS — L669 Cicatricial alopecia, unspecified: Secondary | ICD-10-CM | POA: Diagnosis not present

## 2016-12-13 DIAGNOSIS — L821 Other seborrheic keratosis: Secondary | ICD-10-CM | POA: Diagnosis not present

## 2017-02-14 DIAGNOSIS — Z0000 Encounter for general adult medical examination without abnormal findings: Secondary | ICD-10-CM | POA: Diagnosis not present

## 2017-02-14 DIAGNOSIS — Z23 Encounter for immunization: Secondary | ICD-10-CM | POA: Diagnosis not present

## 2017-02-14 DIAGNOSIS — E559 Vitamin D deficiency, unspecified: Secondary | ICD-10-CM | POA: Diagnosis not present

## 2017-02-14 DIAGNOSIS — Z8673 Personal history of transient ischemic attack (TIA), and cerebral infarction without residual deficits: Secondary | ICD-10-CM | POA: Diagnosis not present

## 2017-02-14 DIAGNOSIS — E039 Hypothyroidism, unspecified: Secondary | ICD-10-CM | POA: Diagnosis not present

## 2017-02-14 DIAGNOSIS — H524 Presbyopia: Secondary | ICD-10-CM | POA: Diagnosis not present

## 2017-02-14 DIAGNOSIS — H35033 Hypertensive retinopathy, bilateral: Secondary | ICD-10-CM | POA: Diagnosis not present

## 2017-02-14 DIAGNOSIS — L509 Urticaria, unspecified: Secondary | ICD-10-CM | POA: Diagnosis not present

## 2017-02-14 DIAGNOSIS — Z1389 Encounter for screening for other disorder: Secondary | ICD-10-CM | POA: Diagnosis not present

## 2017-02-14 DIAGNOSIS — H2513 Age-related nuclear cataract, bilateral: Secondary | ICD-10-CM | POA: Diagnosis not present

## 2017-02-14 DIAGNOSIS — M19041 Primary osteoarthritis, right hand: Secondary | ICD-10-CM | POA: Diagnosis not present

## 2017-02-14 DIAGNOSIS — E782 Mixed hyperlipidemia: Secondary | ICD-10-CM | POA: Diagnosis not present

## 2017-02-14 DIAGNOSIS — R7301 Impaired fasting glucose: Secondary | ICD-10-CM | POA: Diagnosis not present

## 2017-02-14 DIAGNOSIS — I10 Essential (primary) hypertension: Secondary | ICD-10-CM | POA: Diagnosis not present

## 2017-02-14 DIAGNOSIS — M19042 Primary osteoarthritis, left hand: Secondary | ICD-10-CM | POA: Diagnosis not present

## 2017-02-15 DIAGNOSIS — I10 Essential (primary) hypertension: Secondary | ICD-10-CM | POA: Diagnosis not present

## 2017-02-15 DIAGNOSIS — E559 Vitamin D deficiency, unspecified: Secondary | ICD-10-CM | POA: Diagnosis not present

## 2017-02-15 DIAGNOSIS — L659 Nonscarring hair loss, unspecified: Secondary | ICD-10-CM | POA: Diagnosis not present

## 2017-02-15 DIAGNOSIS — E782 Mixed hyperlipidemia: Secondary | ICD-10-CM | POA: Diagnosis not present

## 2017-02-15 DIAGNOSIS — E039 Hypothyroidism, unspecified: Secondary | ICD-10-CM | POA: Diagnosis not present

## 2017-02-15 DIAGNOSIS — N952 Postmenopausal atrophic vaginitis: Secondary | ICD-10-CM | POA: Diagnosis not present

## 2017-03-06 ENCOUNTER — Other Ambulatory Visit: Payer: Self-pay | Admitting: Family Medicine

## 2017-03-06 DIAGNOSIS — Z1231 Encounter for screening mammogram for malignant neoplasm of breast: Secondary | ICD-10-CM

## 2017-04-17 ENCOUNTER — Ambulatory Visit
Admission: RE | Admit: 2017-04-17 | Discharge: 2017-04-17 | Disposition: A | Discharge status: Home / Self Care | Payer: Medicare Other | Source: Ambulatory Visit | Attending: Family Medicine | Admitting: Family Medicine

## 2017-04-17 DIAGNOSIS — Z1231 Encounter for screening mammogram for malignant neoplasm of breast: Secondary | ICD-10-CM

## 2017-06-12 DIAGNOSIS — Z23 Encounter for immunization: Secondary | ICD-10-CM | POA: Diagnosis not present

## 2017-06-12 DIAGNOSIS — N952 Postmenopausal atrophic vaginitis: Secondary | ICD-10-CM | POA: Diagnosis not present

## 2017-06-12 DIAGNOSIS — R3915 Urgency of urination: Secondary | ICD-10-CM | POA: Diagnosis not present

## 2017-08-21 DIAGNOSIS — Z1389 Encounter for screening for other disorder: Secondary | ICD-10-CM | POA: Diagnosis not present

## 2017-08-21 DIAGNOSIS — Z0000 Encounter for general adult medical examination without abnormal findings: Secondary | ICD-10-CM | POA: Diagnosis not present

## 2017-08-21 DIAGNOSIS — I10 Essential (primary) hypertension: Secondary | ICD-10-CM | POA: Diagnosis not present

## 2017-08-21 DIAGNOSIS — L659 Nonscarring hair loss, unspecified: Secondary | ICD-10-CM | POA: Diagnosis not present

## 2017-08-21 DIAGNOSIS — M159 Polyosteoarthritis, unspecified: Secondary | ICD-10-CM | POA: Diagnosis not present

## 2017-08-21 DIAGNOSIS — N952 Postmenopausal atrophic vaginitis: Secondary | ICD-10-CM | POA: Diagnosis not present

## 2017-08-21 DIAGNOSIS — E782 Mixed hyperlipidemia: Secondary | ICD-10-CM | POA: Diagnosis not present

## 2017-08-21 DIAGNOSIS — E039 Hypothyroidism, unspecified: Secondary | ICD-10-CM | POA: Diagnosis not present

## 2017-08-21 DIAGNOSIS — M85851 Other specified disorders of bone density and structure, right thigh: Secondary | ICD-10-CM | POA: Diagnosis not present

## 2017-08-21 DIAGNOSIS — R7301 Impaired fasting glucose: Secondary | ICD-10-CM | POA: Diagnosis not present

## 2017-08-21 DIAGNOSIS — E559 Vitamin D deficiency, unspecified: Secondary | ICD-10-CM | POA: Diagnosis not present

## 2018-01-10 DIAGNOSIS — M25561 Pain in right knee: Secondary | ICD-10-CM | POA: Diagnosis not present

## 2018-02-19 DIAGNOSIS — E559 Vitamin D deficiency, unspecified: Secondary | ICD-10-CM | POA: Diagnosis not present

## 2018-02-19 DIAGNOSIS — I10 Essential (primary) hypertension: Secondary | ICD-10-CM | POA: Diagnosis not present

## 2018-02-19 DIAGNOSIS — E039 Hypothyroidism, unspecified: Secondary | ICD-10-CM | POA: Diagnosis not present

## 2018-02-19 DIAGNOSIS — M159 Polyosteoarthritis, unspecified: Secondary | ICD-10-CM | POA: Diagnosis not present

## 2018-02-19 DIAGNOSIS — Z0000 Encounter for general adult medical examination without abnormal findings: Secondary | ICD-10-CM | POA: Diagnosis not present

## 2018-02-19 DIAGNOSIS — Z1389 Encounter for screening for other disorder: Secondary | ICD-10-CM | POA: Diagnosis not present

## 2018-02-19 DIAGNOSIS — E782 Mixed hyperlipidemia: Secondary | ICD-10-CM | POA: Diagnosis not present

## 2018-02-19 DIAGNOSIS — N952 Postmenopausal atrophic vaginitis: Secondary | ICD-10-CM | POA: Diagnosis not present

## 2018-02-19 DIAGNOSIS — R7301 Impaired fasting glucose: Secondary | ICD-10-CM | POA: Diagnosis not present

## 2018-02-19 DIAGNOSIS — L659 Nonscarring hair loss, unspecified: Secondary | ICD-10-CM | POA: Diagnosis not present

## 2018-02-19 DIAGNOSIS — M85851 Other specified disorders of bone density and structure, right thigh: Secondary | ICD-10-CM | POA: Diagnosis not present

## 2018-02-21 DIAGNOSIS — M85851 Other specified disorders of bone density and structure, right thigh: Secondary | ICD-10-CM | POA: Diagnosis not present

## 2018-02-21 DIAGNOSIS — E039 Hypothyroidism, unspecified: Secondary | ICD-10-CM | POA: Diagnosis not present

## 2018-02-21 DIAGNOSIS — R7301 Impaired fasting glucose: Secondary | ICD-10-CM | POA: Diagnosis not present

## 2018-02-21 DIAGNOSIS — E559 Vitamin D deficiency, unspecified: Secondary | ICD-10-CM | POA: Diagnosis not present

## 2018-02-21 DIAGNOSIS — Z8673 Personal history of transient ischemic attack (TIA), and cerebral infarction without residual deficits: Secondary | ICD-10-CM | POA: Diagnosis not present

## 2018-02-21 DIAGNOSIS — E782 Mixed hyperlipidemia: Secondary | ICD-10-CM | POA: Diagnosis not present

## 2018-02-21 DIAGNOSIS — M159 Polyosteoarthritis, unspecified: Secondary | ICD-10-CM | POA: Diagnosis not present

## 2018-02-21 DIAGNOSIS — N952 Postmenopausal atrophic vaginitis: Secondary | ICD-10-CM | POA: Diagnosis not present

## 2018-02-21 DIAGNOSIS — I10 Essential (primary) hypertension: Secondary | ICD-10-CM | POA: Diagnosis not present

## 2018-02-27 DIAGNOSIS — H35033 Hypertensive retinopathy, bilateral: Secondary | ICD-10-CM | POA: Diagnosis not present

## 2018-03-13 ENCOUNTER — Other Ambulatory Visit: Payer: Self-pay | Admitting: Family Medicine

## 2018-03-13 DIAGNOSIS — Z1231 Encounter for screening mammogram for malignant neoplasm of breast: Secondary | ICD-10-CM

## 2018-04-18 ENCOUNTER — Ambulatory Visit
Admission: RE | Admit: 2018-04-18 | Discharge: 2018-04-18 | Disposition: A | Discharge status: Home / Self Care | Payer: Medicare Other | Source: Ambulatory Visit | Attending: Family Medicine | Admitting: Family Medicine

## 2018-04-18 DIAGNOSIS — Z1231 Encounter for screening mammogram for malignant neoplasm of breast: Secondary | ICD-10-CM

## 2018-08-17 DIAGNOSIS — M85851 Other specified disorders of bone density and structure, right thigh: Secondary | ICD-10-CM | POA: Diagnosis not present

## 2018-08-17 DIAGNOSIS — Z0000 Encounter for general adult medical examination without abnormal findings: Secondary | ICD-10-CM | POA: Diagnosis not present

## 2018-08-17 DIAGNOSIS — M159 Polyosteoarthritis, unspecified: Secondary | ICD-10-CM | POA: Diagnosis not present

## 2018-08-17 DIAGNOSIS — R7301 Impaired fasting glucose: Secondary | ICD-10-CM | POA: Diagnosis not present

## 2018-08-17 DIAGNOSIS — E559 Vitamin D deficiency, unspecified: Secondary | ICD-10-CM | POA: Diagnosis not present

## 2018-08-17 DIAGNOSIS — E039 Hypothyroidism, unspecified: Secondary | ICD-10-CM | POA: Diagnosis not present

## 2018-08-17 DIAGNOSIS — L659 Nonscarring hair loss, unspecified: Secondary | ICD-10-CM | POA: Diagnosis not present

## 2018-08-17 DIAGNOSIS — N952 Postmenopausal atrophic vaginitis: Secondary | ICD-10-CM | POA: Diagnosis not present

## 2018-08-17 DIAGNOSIS — E782 Mixed hyperlipidemia: Secondary | ICD-10-CM | POA: Diagnosis not present

## 2018-08-17 DIAGNOSIS — Z1389 Encounter for screening for other disorder: Secondary | ICD-10-CM | POA: Diagnosis not present

## 2018-08-17 DIAGNOSIS — I10 Essential (primary) hypertension: Secondary | ICD-10-CM | POA: Diagnosis not present

## 2018-08-23 DIAGNOSIS — E039 Hypothyroidism, unspecified: Secondary | ICD-10-CM | POA: Diagnosis not present

## 2018-08-23 DIAGNOSIS — M79644 Pain in right finger(s): Secondary | ICD-10-CM | POA: Diagnosis not present

## 2018-08-23 DIAGNOSIS — E559 Vitamin D deficiency, unspecified: Secondary | ICD-10-CM | POA: Diagnosis not present

## 2018-08-23 DIAGNOSIS — N952 Postmenopausal atrophic vaginitis: Secondary | ICD-10-CM | POA: Diagnosis not present

## 2018-08-23 DIAGNOSIS — Z0000 Encounter for general adult medical examination without abnormal findings: Secondary | ICD-10-CM | POA: Diagnosis not present

## 2018-08-23 DIAGNOSIS — M85851 Other specified disorders of bone density and structure, right thigh: Secondary | ICD-10-CM | POA: Diagnosis not present

## 2018-08-23 DIAGNOSIS — Z1389 Encounter for screening for other disorder: Secondary | ICD-10-CM | POA: Diagnosis not present

## 2018-08-23 DIAGNOSIS — E782 Mixed hyperlipidemia: Secondary | ICD-10-CM | POA: Diagnosis not present

## 2018-08-23 DIAGNOSIS — R7303 Prediabetes: Secondary | ICD-10-CM | POA: Diagnosis not present

## 2018-08-23 DIAGNOSIS — M25561 Pain in right knee: Secondary | ICD-10-CM | POA: Diagnosis not present

## 2018-08-23 DIAGNOSIS — Z01419 Encounter for gynecological examination (general) (routine) without abnormal findings: Secondary | ICD-10-CM | POA: Diagnosis not present

## 2018-08-23 DIAGNOSIS — I10 Essential (primary) hypertension: Secondary | ICD-10-CM | POA: Diagnosis not present

## 2018-08-24 ENCOUNTER — Other Ambulatory Visit: Payer: Self-pay | Admitting: Family Medicine

## 2018-08-24 DIAGNOSIS — M85851 Other specified disorders of bone density and structure, right thigh: Secondary | ICD-10-CM

## 2018-09-03 DIAGNOSIS — M25561 Pain in right knee: Secondary | ICD-10-CM | POA: Diagnosis not present

## 2018-09-11 DIAGNOSIS — S83241D Other tear of medial meniscus, current injury, right knee, subsequent encounter: Secondary | ICD-10-CM | POA: Diagnosis not present

## 2018-09-11 DIAGNOSIS — M25561 Pain in right knee: Secondary | ICD-10-CM | POA: Diagnosis not present

## 2018-09-11 DIAGNOSIS — M79644 Pain in right finger(s): Secondary | ICD-10-CM | POA: Diagnosis not present

## 2018-09-11 DIAGNOSIS — M1811 Unilateral primary osteoarthritis of first carpometacarpal joint, right hand: Secondary | ICD-10-CM | POA: Diagnosis not present

## 2018-10-05 DIAGNOSIS — M25561 Pain in right knee: Secondary | ICD-10-CM | POA: Diagnosis not present

## 2018-10-05 DIAGNOSIS — Z01818 Encounter for other preprocedural examination: Secondary | ICD-10-CM | POA: Diagnosis not present

## 2018-11-21 DIAGNOSIS — X58XXXA Exposure to other specified factors, initial encounter: Secondary | ICD-10-CM | POA: Diagnosis not present

## 2018-11-21 DIAGNOSIS — G8918 Other acute postprocedural pain: Secondary | ICD-10-CM | POA: Diagnosis not present

## 2018-11-21 DIAGNOSIS — Y999 Unspecified external cause status: Secondary | ICD-10-CM | POA: Diagnosis not present

## 2018-11-21 DIAGNOSIS — S83261A Peripheral tear of lateral meniscus, current injury, right knee, initial encounter: Secondary | ICD-10-CM | POA: Diagnosis not present

## 2018-11-21 DIAGNOSIS — M94261 Chondromalacia, right knee: Secondary | ICD-10-CM | POA: Diagnosis not present

## 2018-11-21 DIAGNOSIS — S83231A Complex tear of medial meniscus, current injury, right knee, initial encounter: Secondary | ICD-10-CM | POA: Diagnosis not present

## 2018-12-07 DIAGNOSIS — M25561 Pain in right knee: Secondary | ICD-10-CM | POA: Diagnosis not present

## 2018-12-11 DIAGNOSIS — M25561 Pain in right knee: Secondary | ICD-10-CM | POA: Diagnosis not present

## 2018-12-13 DIAGNOSIS — M25561 Pain in right knee: Secondary | ICD-10-CM | POA: Diagnosis not present

## 2018-12-18 DIAGNOSIS — M25561 Pain in right knee: Secondary | ICD-10-CM | POA: Diagnosis not present

## 2019-01-28 ENCOUNTER — Other Ambulatory Visit: Payer: Self-pay | Admitting: Family Medicine

## 2019-01-28 DIAGNOSIS — Z1231 Encounter for screening mammogram for malignant neoplasm of breast: Secondary | ICD-10-CM

## 2019-02-21 DIAGNOSIS — E782 Mixed hyperlipidemia: Secondary | ICD-10-CM | POA: Diagnosis not present

## 2019-02-21 DIAGNOSIS — N952 Postmenopausal atrophic vaginitis: Secondary | ICD-10-CM | POA: Diagnosis not present

## 2019-02-21 DIAGNOSIS — Z8673 Personal history of transient ischemic attack (TIA), and cerebral infarction without residual deficits: Secondary | ICD-10-CM | POA: Diagnosis not present

## 2019-02-21 DIAGNOSIS — E559 Vitamin D deficiency, unspecified: Secondary | ICD-10-CM | POA: Diagnosis not present

## 2019-02-21 DIAGNOSIS — E039 Hypothyroidism, unspecified: Secondary | ICD-10-CM | POA: Diagnosis not present

## 2019-02-21 DIAGNOSIS — R7303 Prediabetes: Secondary | ICD-10-CM | POA: Diagnosis not present

## 2019-02-26 DIAGNOSIS — M25561 Pain in right knee: Secondary | ICD-10-CM | POA: Diagnosis not present

## 2019-02-26 DIAGNOSIS — M79644 Pain in right finger(s): Secondary | ICD-10-CM | POA: Diagnosis not present

## 2019-02-26 DIAGNOSIS — E782 Mixed hyperlipidemia: Secondary | ICD-10-CM | POA: Diagnosis not present

## 2019-02-26 DIAGNOSIS — Z0000 Encounter for general adult medical examination without abnormal findings: Secondary | ICD-10-CM | POA: Diagnosis not present

## 2019-02-26 DIAGNOSIS — E039 Hypothyroidism, unspecified: Secondary | ICD-10-CM | POA: Diagnosis not present

## 2019-02-26 DIAGNOSIS — Z8673 Personal history of transient ischemic attack (TIA), and cerebral infarction without residual deficits: Secondary | ICD-10-CM | POA: Diagnosis not present

## 2019-02-26 DIAGNOSIS — Z01818 Encounter for other preprocedural examination: Secondary | ICD-10-CM | POA: Diagnosis not present

## 2019-02-26 DIAGNOSIS — Z1389 Encounter for screening for other disorder: Secondary | ICD-10-CM | POA: Diagnosis not present

## 2019-02-26 DIAGNOSIS — M85851 Other specified disorders of bone density and structure, right thigh: Secondary | ICD-10-CM | POA: Diagnosis not present

## 2019-02-26 DIAGNOSIS — M159 Polyosteoarthritis, unspecified: Secondary | ICD-10-CM | POA: Diagnosis not present

## 2019-02-26 DIAGNOSIS — E559 Vitamin D deficiency, unspecified: Secondary | ICD-10-CM | POA: Diagnosis not present

## 2019-02-26 DIAGNOSIS — I10 Essential (primary) hypertension: Secondary | ICD-10-CM | POA: Diagnosis not present

## 2019-02-26 DIAGNOSIS — N952 Postmenopausal atrophic vaginitis: Secondary | ICD-10-CM | POA: Diagnosis not present

## 2019-02-26 DIAGNOSIS — R7303 Prediabetes: Secondary | ICD-10-CM | POA: Diagnosis not present

## 2019-03-14 DIAGNOSIS — Z1211 Encounter for screening for malignant neoplasm of colon: Secondary | ICD-10-CM | POA: Diagnosis not present

## 2019-03-14 DIAGNOSIS — Z1212 Encounter for screening for malignant neoplasm of rectum: Secondary | ICD-10-CM | POA: Diagnosis not present

## 2019-04-24 DIAGNOSIS — N3001 Acute cystitis with hematuria: Secondary | ICD-10-CM | POA: Diagnosis not present

## 2019-04-24 DIAGNOSIS — R102 Pelvic and perineal pain: Secondary | ICD-10-CM | POA: Diagnosis not present

## 2019-04-24 DIAGNOSIS — N949 Unspecified condition associated with female genital organs and menstrual cycle: Secondary | ICD-10-CM | POA: Diagnosis not present

## 2019-04-24 DIAGNOSIS — R350 Frequency of micturition: Secondary | ICD-10-CM | POA: Diagnosis not present

## 2019-05-01 ENCOUNTER — Other Ambulatory Visit: Payer: Medicare Other

## 2019-05-01 ENCOUNTER — Ambulatory Visit: Payer: Medicare Other

## 2019-05-24 DIAGNOSIS — Z20828 Contact with and (suspected) exposure to other viral communicable diseases: Secondary | ICD-10-CM | POA: Diagnosis not present

## 2019-07-24 ENCOUNTER — Ambulatory Visit: Payer: Medicare Other

## 2019-07-24 ENCOUNTER — Other Ambulatory Visit: Payer: Medicare Other

## 2019-07-29 DIAGNOSIS — Z23 Encounter for immunization: Secondary | ICD-10-CM | POA: Diagnosis not present

## 2019-08-18 DIAGNOSIS — Z20828 Contact with and (suspected) exposure to other viral communicable diseases: Secondary | ICD-10-CM | POA: Diagnosis not present

## 2019-08-30 DIAGNOSIS — K136 Irritative hyperplasia of oral mucosa: Secondary | ICD-10-CM | POA: Diagnosis not present

## 2019-09-02 DIAGNOSIS — E782 Mixed hyperlipidemia: Secondary | ICD-10-CM | POA: Diagnosis not present

## 2019-09-02 DIAGNOSIS — R350 Frequency of micturition: Secondary | ICD-10-CM | POA: Diagnosis not present

## 2019-09-02 DIAGNOSIS — E559 Vitamin D deficiency, unspecified: Secondary | ICD-10-CM | POA: Diagnosis not present

## 2019-09-02 DIAGNOSIS — N949 Unspecified condition associated with female genital organs and menstrual cycle: Secondary | ICD-10-CM | POA: Diagnosis not present

## 2019-09-02 DIAGNOSIS — R7303 Prediabetes: Secondary | ICD-10-CM | POA: Diagnosis not present

## 2019-09-02 DIAGNOSIS — R102 Pelvic and perineal pain: Secondary | ICD-10-CM | POA: Diagnosis not present

## 2019-09-02 DIAGNOSIS — N952 Postmenopausal atrophic vaginitis: Secondary | ICD-10-CM | POA: Diagnosis not present

## 2019-09-02 DIAGNOSIS — E039 Hypothyroidism, unspecified: Secondary | ICD-10-CM | POA: Diagnosis not present

## 2019-09-03 DIAGNOSIS — Z0000 Encounter for general adult medical examination without abnormal findings: Secondary | ICD-10-CM | POA: Diagnosis not present

## 2019-09-03 DIAGNOSIS — E782 Mixed hyperlipidemia: Secondary | ICD-10-CM | POA: Diagnosis not present

## 2019-09-03 DIAGNOSIS — I10 Essential (primary) hypertension: Secondary | ICD-10-CM | POA: Diagnosis not present

## 2019-09-03 DIAGNOSIS — M85851 Other specified disorders of bone density and structure, right thigh: Secondary | ICD-10-CM | POA: Diagnosis not present

## 2019-09-03 DIAGNOSIS — R7301 Impaired fasting glucose: Secondary | ICD-10-CM | POA: Diagnosis not present

## 2019-09-03 DIAGNOSIS — N952 Postmenopausal atrophic vaginitis: Secondary | ICD-10-CM | POA: Diagnosis not present

## 2019-09-03 DIAGNOSIS — Z8673 Personal history of transient ischemic attack (TIA), and cerebral infarction without residual deficits: Secondary | ICD-10-CM | POA: Diagnosis not present

## 2019-09-03 DIAGNOSIS — E039 Hypothyroidism, unspecified: Secondary | ICD-10-CM | POA: Diagnosis not present

## 2019-09-03 DIAGNOSIS — E559 Vitamin D deficiency, unspecified: Secondary | ICD-10-CM | POA: Diagnosis not present

## 2019-09-03 DIAGNOSIS — Z1389 Encounter for screening for other disorder: Secondary | ICD-10-CM | POA: Diagnosis not present

## 2019-10-17 DIAGNOSIS — Z20828 Contact with and (suspected) exposure to other viral communicable diseases: Secondary | ICD-10-CM | POA: Diagnosis not present

## 2019-11-06 DIAGNOSIS — K148 Other diseases of tongue: Secondary | ICD-10-CM | POA: Diagnosis not present

## 2019-11-08 ENCOUNTER — Ambulatory Visit: Payer: Medicare Other | Attending: Internal Medicine

## 2019-11-08 DIAGNOSIS — Z23 Encounter for immunization: Secondary | ICD-10-CM

## 2019-11-08 NOTE — Progress Notes (Signed)
   Covid-19 Vaccination Clinic  Name:  Debra Herman    MRN: [id number] DOB: Aug 06, 1946  11/08/2019  Debra Herman was observed post Covid-19 immunization for 15 minutes without incidence. She was provided with Vaccine Information Sheet and instruction to access the V-Safe system.   Debra Herman was instructed to call 911 with any severe reactions post vaccine: Marland Kitchen Difficulty breathing  . Swelling of your face and throat  . A fast heartbeat  . A bad rash all over your body  . Dizziness and weakness    Immunizations Administered    Name Date Dose VIS Date Route   Pfizer COVID-19 Vaccine 11/08/2019  9:20 AM 0.3 mL 09/27/2019 Intramuscular   Manufacturer: [place]   Lot: [id number]   [place]: [id number]

## 2019-11-29 ENCOUNTER — Ambulatory Visit: Payer: Medicare Other | Attending: Internal Medicine

## 2019-11-29 DIAGNOSIS — Z23 Encounter for immunization: Secondary | ICD-10-CM | POA: Insufficient documentation

## 2019-11-29 NOTE — Progress Notes (Signed)
   Covid-19 Vaccination Clinic  Name:  Debra Herman    MRN: [id number] DOB: 16-Feb-1946  11/29/2019  [name] was observed post Covid-19 immunization for 15 minutes without incidence. She was provided with Vaccine Information Sheet and instruction to access the V-Safe system.   [name] was instructed to call 911 with any severe reactions post vaccine: Marland Kitchen Difficulty breathing  . Swelling of your face and throat  . A fast heartbeat  . A bad rash all over your body  . Dizziness and weakness    Immunizations Administered    Name Date Dose VIS Date Route   Pfizer COVID-19 Vaccine 11/29/2019 10:27 AM 0.3 mL 09/27/2019 Intramuscular   Manufacturer: [place]   Lot: [id number]   [place]: [id number]

## 2019-12-04 DIAGNOSIS — Z20828 Contact with and (suspected) exposure to other viral communicable diseases: Secondary | ICD-10-CM | POA: Diagnosis not present

## 2020-01-09 ENCOUNTER — Ambulatory Visit: Payer: Medicare Other

## 2020-01-24 ENCOUNTER — Other Ambulatory Visit: Payer: Self-pay

## 2020-01-24 ENCOUNTER — Ambulatory Visit
Admission: RE | Admit: 2020-01-24 | Discharge: 2020-01-24 | Disposition: A | Discharge status: Home / Self Care | Payer: Medicare Other | Source: Ambulatory Visit | Attending: Family Medicine | Admitting: Family Medicine

## 2020-01-24 DIAGNOSIS — Z1231 Encounter for screening mammogram for malignant neoplasm of breast: Secondary | ICD-10-CM

## 2020-02-13 DIAGNOSIS — E039 Hypothyroidism, unspecified: Secondary | ICD-10-CM | POA: Diagnosis not present

## 2020-02-13 DIAGNOSIS — R7309 Other abnormal glucose: Secondary | ICD-10-CM | POA: Diagnosis not present

## 2020-02-17 DIAGNOSIS — E782 Mixed hyperlipidemia: Secondary | ICD-10-CM | POA: Diagnosis not present

## 2020-02-17 DIAGNOSIS — I10 Essential (primary) hypertension: Secondary | ICD-10-CM | POA: Diagnosis not present

## 2020-02-17 DIAGNOSIS — R7309 Other abnormal glucose: Secondary | ICD-10-CM | POA: Diagnosis not present

## 2020-02-17 DIAGNOSIS — E039 Hypothyroidism, unspecified: Secondary | ICD-10-CM | POA: Diagnosis not present

## 2020-02-17 DIAGNOSIS — N952 Postmenopausal atrophic vaginitis: Secondary | ICD-10-CM | POA: Diagnosis not present

## 2020-02-17 DIAGNOSIS — Z23 Encounter for immunization: Secondary | ICD-10-CM | POA: Diagnosis not present

## 2020-02-17 DIAGNOSIS — Z8673 Personal history of transient ischemic attack (TIA), and cerebral infarction without residual deficits: Secondary | ICD-10-CM | POA: Diagnosis not present

## 2020-04-02 DIAGNOSIS — G459 Transient cerebral ischemic attack, unspecified: Secondary | ICD-10-CM | POA: Diagnosis not present

## 2020-04-03 ENCOUNTER — Other Ambulatory Visit: Payer: Self-pay | Admitting: Family Medicine

## 2020-04-03 DIAGNOSIS — G459 Transient cerebral ischemic attack, unspecified: Secondary | ICD-10-CM

## 2020-04-20 ENCOUNTER — Other Ambulatory Visit: Payer: Self-pay

## 2020-04-20 ENCOUNTER — Ambulatory Visit (INDEPENDENT_AMBULATORY_CARE_PROVIDER_SITE_OTHER): Payer: Medicare Other

## 2020-04-20 DIAGNOSIS — G459 Transient cerebral ischemic attack, unspecified: Secondary | ICD-10-CM

## 2020-04-20 MED ORDER — GADOBUTROL 1 MMOL/ML IV SOLN
6.0000 mL | Freq: Once | INTRAVENOUS | Status: AC | PRN
Start: 2020-04-20 — End: 2020-04-20

## 2020-04-30 DIAGNOSIS — I10 Essential (primary) hypertension: Secondary | ICD-10-CM | POA: Diagnosis not present

## 2020-04-30 DIAGNOSIS — E782 Mixed hyperlipidemia: Secondary | ICD-10-CM | POA: Diagnosis not present

## 2020-04-30 DIAGNOSIS — G459 Transient cerebral ischemic attack, unspecified: Secondary | ICD-10-CM | POA: Diagnosis not present

## 2020-06-01 DIAGNOSIS — Z03818 Encounter for observation for suspected exposure to other biological agents ruled out: Secondary | ICD-10-CM | POA: Diagnosis not present

## 2020-06-01 DIAGNOSIS — Z20822 Contact with and (suspected) exposure to covid-19: Secondary | ICD-10-CM | POA: Diagnosis not present

## 2020-06-17 DIAGNOSIS — D485 Neoplasm of uncertain behavior of skin: Secondary | ICD-10-CM | POA: Diagnosis not present

## 2020-06-17 DIAGNOSIS — L820 Inflamed seborrheic keratosis: Secondary | ICD-10-CM | POA: Diagnosis not present

## 2020-07-14 DIAGNOSIS — Z23 Encounter for immunization: Secondary | ICD-10-CM | POA: Diagnosis not present

## 2020-07-25 DIAGNOSIS — Z20822 Contact with and (suspected) exposure to covid-19: Secondary | ICD-10-CM | POA: Diagnosis not present

## 2020-07-25 DIAGNOSIS — Z03818 Encounter for observation for suspected exposure to other biological agents ruled out: Secondary | ICD-10-CM | POA: Diagnosis not present

## 2020-09-04 DIAGNOSIS — E559 Vitamin D deficiency, unspecified: Secondary | ICD-10-CM | POA: Diagnosis not present

## 2020-09-04 DIAGNOSIS — Z8673 Personal history of transient ischemic attack (TIA), and cerebral infarction without residual deficits: Secondary | ICD-10-CM | POA: Diagnosis not present

## 2020-09-04 DIAGNOSIS — M85851 Other specified disorders of bone density and structure, right thigh: Secondary | ICD-10-CM | POA: Diagnosis not present

## 2020-09-04 DIAGNOSIS — I10 Essential (primary) hypertension: Secondary | ICD-10-CM | POA: Diagnosis not present

## 2020-09-04 DIAGNOSIS — R7309 Other abnormal glucose: Secondary | ICD-10-CM | POA: Diagnosis not present

## 2020-09-04 DIAGNOSIS — E039 Hypothyroidism, unspecified: Secondary | ICD-10-CM | POA: Diagnosis not present

## 2020-09-04 DIAGNOSIS — Z0000 Encounter for general adult medical examination without abnormal findings: Secondary | ICD-10-CM | POA: Diagnosis not present

## 2020-09-04 DIAGNOSIS — E782 Mixed hyperlipidemia: Secondary | ICD-10-CM | POA: Diagnosis not present

## 2020-09-07 DIAGNOSIS — Z0000 Encounter for general adult medical examination without abnormal findings: Secondary | ICD-10-CM | POA: Diagnosis not present

## 2020-09-07 DIAGNOSIS — I10 Essential (primary) hypertension: Secondary | ICD-10-CM | POA: Diagnosis not present

## 2020-09-07 DIAGNOSIS — R945 Abnormal results of liver function studies: Secondary | ICD-10-CM | POA: Diagnosis not present

## 2020-09-07 DIAGNOSIS — E559 Vitamin D deficiency, unspecified: Secondary | ICD-10-CM | POA: Diagnosis not present

## 2020-09-07 DIAGNOSIS — E039 Hypothyroidism, unspecified: Secondary | ICD-10-CM | POA: Diagnosis not present

## 2020-09-07 DIAGNOSIS — M85851 Other specified disorders of bone density and structure, right thigh: Secondary | ICD-10-CM | POA: Diagnosis not present

## 2020-09-07 DIAGNOSIS — R7309 Other abnormal glucose: Secondary | ICD-10-CM | POA: Diagnosis not present

## 2020-09-07 DIAGNOSIS — E782 Mixed hyperlipidemia: Secondary | ICD-10-CM | POA: Diagnosis not present

## 2020-09-07 DIAGNOSIS — Z1389 Encounter for screening for other disorder: Secondary | ICD-10-CM | POA: Diagnosis not present

## 2020-09-07 DIAGNOSIS — Z8673 Personal history of transient ischemic attack (TIA), and cerebral infarction without residual deficits: Secondary | ICD-10-CM | POA: Diagnosis not present

## 2020-10-02 ENCOUNTER — Other Ambulatory Visit: Payer: Self-pay | Admitting: Family Medicine

## 2020-10-02 DIAGNOSIS — M85851 Other specified disorders of bone density and structure, right thigh: Secondary | ICD-10-CM

## 2020-10-02 DIAGNOSIS — Z1231 Encounter for screening mammogram for malignant neoplasm of breast: Secondary | ICD-10-CM

## 2020-10-27 DIAGNOSIS — L821 Other seborrheic keratosis: Secondary | ICD-10-CM | POA: Diagnosis not present

## 2021-01-04 DIAGNOSIS — H35033 Hypertensive retinopathy, bilateral: Secondary | ICD-10-CM | POA: Diagnosis not present

## 2021-01-08 ENCOUNTER — Other Ambulatory Visit: Payer: Medicare Other

## 2021-01-27 DIAGNOSIS — S0990XA Unspecified injury of head, initial encounter: Secondary | ICD-10-CM | POA: Diagnosis not present

## 2021-01-27 DIAGNOSIS — Z8673 Personal history of transient ischemic attack (TIA), and cerebral infarction without residual deficits: Secondary | ICD-10-CM | POA: Diagnosis not present

## 2021-01-27 DIAGNOSIS — Z87891 Personal history of nicotine dependence: Secondary | ICD-10-CM | POA: Diagnosis not present

## 2021-01-27 DIAGNOSIS — Z91040 Latex allergy status: Secondary | ICD-10-CM | POA: Diagnosis not present

## 2021-01-27 DIAGNOSIS — Z7982 Long term (current) use of aspirin: Secondary | ICD-10-CM | POA: Diagnosis not present

## 2021-01-27 DIAGNOSIS — Z882 Allergy status to sulfonamides status: Secondary | ICD-10-CM | POA: Diagnosis not present

## 2021-01-27 DIAGNOSIS — Z79899 Other long term (current) drug therapy: Secondary | ICD-10-CM | POA: Diagnosis not present

## 2021-01-27 DIAGNOSIS — E039 Hypothyroidism, unspecified: Secondary | ICD-10-CM | POA: Diagnosis not present

## 2021-01-27 DIAGNOSIS — S060X1A Concussion with loss of consciousness of 30 minutes or less, initial encounter: Secondary | ICD-10-CM | POA: Diagnosis not present

## 2021-01-27 DIAGNOSIS — I10 Essential (primary) hypertension: Secondary | ICD-10-CM | POA: Diagnosis not present

## 2021-01-27 DIAGNOSIS — G8911 Acute pain due to trauma: Secondary | ICD-10-CM | POA: Diagnosis not present

## 2021-01-27 DIAGNOSIS — S199XXA Unspecified injury of neck, initial encounter: Secondary | ICD-10-CM | POA: Diagnosis not present

## 2021-01-27 DIAGNOSIS — Z7902 Long term (current) use of antithrombotics/antiplatelets: Secondary | ICD-10-CM | POA: Diagnosis not present

## 2021-02-04 ENCOUNTER — Other Ambulatory Visit: Payer: Medicare Other

## 2021-02-08 ENCOUNTER — Ambulatory Visit
Admission: RE | Admit: 2021-02-08 | Discharge: 2021-02-08 | Disposition: A | Discharge status: Home / Self Care | Payer: Medicare Other | Source: Ambulatory Visit | Attending: Family Medicine | Admitting: Family Medicine

## 2021-02-08 ENCOUNTER — Other Ambulatory Visit: Payer: Self-pay

## 2021-02-08 DIAGNOSIS — Z1231 Encounter for screening mammogram for malignant neoplasm of breast: Secondary | ICD-10-CM | POA: Diagnosis not present

## 2021-02-18 ENCOUNTER — Ambulatory Visit
Admission: RE | Admit: 2021-02-18 | Discharge: 2021-02-18 | Disposition: A | Discharge status: Home / Self Care | Payer: Medicare Other | Source: Ambulatory Visit | Attending: Family Medicine | Admitting: Family Medicine

## 2021-02-18 ENCOUNTER — Other Ambulatory Visit: Payer: Self-pay

## 2021-02-18 DIAGNOSIS — Z780 Asymptomatic menopausal state: Secondary | ICD-10-CM | POA: Diagnosis not present

## 2021-02-18 DIAGNOSIS — M85851 Other specified disorders of bone density and structure, right thigh: Secondary | ICD-10-CM

## 2021-02-18 DIAGNOSIS — M8589 Other specified disorders of bone density and structure, multiple sites: Secondary | ICD-10-CM | POA: Diagnosis not present

## 2021-02-23 DIAGNOSIS — H35363 Drusen (degenerative) of macula, bilateral: Secondary | ICD-10-CM | POA: Diagnosis not present

## 2021-03-11 DIAGNOSIS — R7989 Other specified abnormal findings of blood chemistry: Secondary | ICD-10-CM | POA: Diagnosis not present

## 2021-03-11 DIAGNOSIS — N952 Postmenopausal atrophic vaginitis: Secondary | ICD-10-CM | POA: Diagnosis not present

## 2021-03-11 DIAGNOSIS — Z1389 Encounter for screening for other disorder: Secondary | ICD-10-CM | POA: Diagnosis not present

## 2021-03-11 DIAGNOSIS — Z0000 Encounter for general adult medical examination without abnormal findings: Secondary | ICD-10-CM | POA: Diagnosis not present

## 2021-03-11 DIAGNOSIS — Z8673 Personal history of transient ischemic attack (TIA), and cerebral infarction without residual deficits: Secondary | ICD-10-CM | POA: Diagnosis not present

## 2021-03-11 DIAGNOSIS — E782 Mixed hyperlipidemia: Secondary | ICD-10-CM | POA: Diagnosis not present

## 2021-03-11 DIAGNOSIS — R7309 Other abnormal glucose: Secondary | ICD-10-CM | POA: Diagnosis not present

## 2021-03-11 DIAGNOSIS — E039 Hypothyroidism, unspecified: Secondary | ICD-10-CM | POA: Diagnosis not present

## 2021-03-11 DIAGNOSIS — M85851 Other specified disorders of bone density and structure, right thigh: Secondary | ICD-10-CM | POA: Diagnosis not present

## 2021-03-11 DIAGNOSIS — E559 Vitamin D deficiency, unspecified: Secondary | ICD-10-CM | POA: Diagnosis not present

## 2021-03-11 DIAGNOSIS — R7301 Impaired fasting glucose: Secondary | ICD-10-CM | POA: Diagnosis not present

## 2021-03-11 DIAGNOSIS — I10 Essential (primary) hypertension: Secondary | ICD-10-CM | POA: Diagnosis not present

## 2021-03-16 DIAGNOSIS — Z8673 Personal history of transient ischemic attack (TIA), and cerebral infarction without residual deficits: Secondary | ICD-10-CM | POA: Diagnosis not present

## 2021-03-16 DIAGNOSIS — N952 Postmenopausal atrophic vaginitis: Secondary | ICD-10-CM | POA: Diagnosis not present

## 2021-03-16 DIAGNOSIS — E039 Hypothyroidism, unspecified: Secondary | ICD-10-CM | POA: Diagnosis not present

## 2021-03-16 DIAGNOSIS — R7303 Prediabetes: Secondary | ICD-10-CM | POA: Diagnosis not present

## 2021-03-16 DIAGNOSIS — I10 Essential (primary) hypertension: Secondary | ICD-10-CM | POA: Diagnosis not present

## 2021-03-16 DIAGNOSIS — E559 Vitamin D deficiency, unspecified: Secondary | ICD-10-CM | POA: Diagnosis not present

## 2021-03-16 DIAGNOSIS — E782 Mixed hyperlipidemia: Secondary | ICD-10-CM | POA: Diagnosis not present

## 2021-03-17 DIAGNOSIS — Z20822 Contact with and (suspected) exposure to covid-19: Secondary | ICD-10-CM | POA: Diagnosis not present

## 2021-07-27 DIAGNOSIS — H2513 Age-related nuclear cataract, bilateral: Secondary | ICD-10-CM | POA: Diagnosis not present

## 2021-09-07 DIAGNOSIS — R7303 Prediabetes: Secondary | ICD-10-CM | POA: Diagnosis not present

## 2021-09-07 DIAGNOSIS — E782 Mixed hyperlipidemia: Secondary | ICD-10-CM | POA: Diagnosis not present

## 2021-09-07 DIAGNOSIS — R7309 Other abnormal glucose: Secondary | ICD-10-CM | POA: Diagnosis not present

## 2021-09-07 DIAGNOSIS — E039 Hypothyroidism, unspecified: Secondary | ICD-10-CM | POA: Diagnosis not present

## 2021-09-13 DIAGNOSIS — E559 Vitamin D deficiency, unspecified: Secondary | ICD-10-CM | POA: Diagnosis not present

## 2021-09-13 DIAGNOSIS — Z8673 Personal history of transient ischemic attack (TIA), and cerebral infarction without residual deficits: Secondary | ICD-10-CM | POA: Diagnosis not present

## 2021-09-13 DIAGNOSIS — Z0000 Encounter for general adult medical examination without abnormal findings: Secondary | ICD-10-CM | POA: Diagnosis not present

## 2021-09-13 DIAGNOSIS — E039 Hypothyroidism, unspecified: Secondary | ICD-10-CM | POA: Diagnosis not present

## 2021-09-13 DIAGNOSIS — E782 Mixed hyperlipidemia: Secondary | ICD-10-CM | POA: Diagnosis not present

## 2021-09-13 DIAGNOSIS — Z1389 Encounter for screening for other disorder: Secondary | ICD-10-CM | POA: Diagnosis not present

## 2021-09-13 DIAGNOSIS — Z01419 Encounter for gynecological examination (general) (routine) without abnormal findings: Secondary | ICD-10-CM | POA: Diagnosis not present

## 2021-09-13 DIAGNOSIS — N952 Postmenopausal atrophic vaginitis: Secondary | ICD-10-CM | POA: Diagnosis not present

## 2021-09-13 DIAGNOSIS — R7303 Prediabetes: Secondary | ICD-10-CM | POA: Diagnosis not present

## 2021-09-13 DIAGNOSIS — I10 Essential (primary) hypertension: Secondary | ICD-10-CM | POA: Diagnosis not present

## 2021-12-08 DIAGNOSIS — L578 Other skin changes due to chronic exposure to nonionizing radiation: Secondary | ICD-10-CM | POA: Diagnosis not present

## 2021-12-08 DIAGNOSIS — L821 Other seborrheic keratosis: Secondary | ICD-10-CM | POA: Diagnosis not present

## 2021-12-08 DIAGNOSIS — L814 Other melanin hyperpigmentation: Secondary | ICD-10-CM | POA: Diagnosis not present

## 2021-12-08 DIAGNOSIS — D225 Melanocytic nevi of trunk: Secondary | ICD-10-CM | POA: Diagnosis not present

## 2022-01-07 ENCOUNTER — Other Ambulatory Visit: Payer: Self-pay | Admitting: Family Medicine

## 2022-01-07 DIAGNOSIS — Z1231 Encounter for screening mammogram for malignant neoplasm of breast: Secondary | ICD-10-CM

## 2022-01-14 DIAGNOSIS — H2513 Age-related nuclear cataract, bilateral: Secondary | ICD-10-CM | POA: Diagnosis not present

## 2022-01-14 DIAGNOSIS — H353132 Nonexudative age-related macular degeneration, bilateral, intermediate dry stage: Secondary | ICD-10-CM | POA: Diagnosis not present

## 2022-01-14 DIAGNOSIS — H25013 Cortical age-related cataract, bilateral: Secondary | ICD-10-CM | POA: Diagnosis not present

## 2022-01-14 DIAGNOSIS — H5213 Myopia, bilateral: Secondary | ICD-10-CM | POA: Diagnosis not present

## 2022-02-09 ENCOUNTER — Ambulatory Visit
Admission: RE | Admit: 2022-02-09 | Discharge: 2022-02-09 | Disposition: A | Discharge status: Home / Self Care | Payer: Medicare Other | Source: Ambulatory Visit | Attending: Family Medicine | Admitting: Family Medicine

## 2022-02-09 DIAGNOSIS — Z1231 Encounter for screening mammogram for malignant neoplasm of breast: Secondary | ICD-10-CM

## 2022-02-12 DIAGNOSIS — Z20822 Contact with and (suspected) exposure to covid-19: Secondary | ICD-10-CM | POA: Diagnosis not present

## 2022-02-16 DIAGNOSIS — Z20822 Contact with and (suspected) exposure to covid-19: Secondary | ICD-10-CM | POA: Diagnosis not present

## 2022-02-24 DIAGNOSIS — H2511 Age-related nuclear cataract, right eye: Secondary | ICD-10-CM | POA: Diagnosis not present

## 2022-02-24 DIAGNOSIS — H25811 Combined forms of age-related cataract, right eye: Secondary | ICD-10-CM | POA: Diagnosis not present

## 2022-02-24 DIAGNOSIS — H25011 Cortical age-related cataract, right eye: Secondary | ICD-10-CM | POA: Diagnosis not present

## 2022-03-10 DIAGNOSIS — E039 Hypothyroidism, unspecified: Secondary | ICD-10-CM | POA: Diagnosis not present

## 2022-03-10 DIAGNOSIS — R7309 Other abnormal glucose: Secondary | ICD-10-CM | POA: Diagnosis not present

## 2022-03-17 DIAGNOSIS — I10 Essential (primary) hypertension: Secondary | ICD-10-CM | POA: Diagnosis not present

## 2022-03-17 DIAGNOSIS — R7309 Other abnormal glucose: Secondary | ICD-10-CM | POA: Diagnosis not present

## 2022-03-17 DIAGNOSIS — E782 Mixed hyperlipidemia: Secondary | ICD-10-CM | POA: Diagnosis not present

## 2022-03-17 DIAGNOSIS — E039 Hypothyroidism, unspecified: Secondary | ICD-10-CM | POA: Diagnosis not present

## 2022-03-17 DIAGNOSIS — Z1211 Encounter for screening for malignant neoplasm of colon: Secondary | ICD-10-CM | POA: Diagnosis not present

## 2022-03-17 DIAGNOSIS — Z8673 Personal history of transient ischemic attack (TIA), and cerebral infarction without residual deficits: Secondary | ICD-10-CM | POA: Diagnosis not present

## 2022-04-06 DIAGNOSIS — Z1212 Encounter for screening for malignant neoplasm of rectum: Secondary | ICD-10-CM | POA: Diagnosis not present

## 2022-04-06 DIAGNOSIS — Z1211 Encounter for screening for malignant neoplasm of colon: Secondary | ICD-10-CM | POA: Diagnosis not present

## 2022-04-13 LAB — COLOGUARD: COLOGUARD: NEGATIVE

## 2022-04-14 DIAGNOSIS — H25012 Cortical age-related cataract, left eye: Secondary | ICD-10-CM | POA: Diagnosis not present

## 2022-04-14 DIAGNOSIS — H52222 Regular astigmatism, left eye: Secondary | ICD-10-CM | POA: Diagnosis not present

## 2022-04-14 DIAGNOSIS — H2512 Age-related nuclear cataract, left eye: Secondary | ICD-10-CM | POA: Diagnosis not present

## 2022-04-14 DIAGNOSIS — H269 Unspecified cataract: Secondary | ICD-10-CM | POA: Diagnosis not present

## 2022-04-14 DIAGNOSIS — I10 Essential (primary) hypertension: Secondary | ICD-10-CM | POA: Diagnosis not present

## 2022-04-14 DIAGNOSIS — H25812 Combined forms of age-related cataract, left eye: Secondary | ICD-10-CM | POA: Diagnosis not present

## 2022-07-12 DIAGNOSIS — Z23 Encounter for immunization: Secondary | ICD-10-CM | POA: Diagnosis not present

## 2022-09-12 DIAGNOSIS — Z0000 Encounter for general adult medical examination without abnormal findings: Secondary | ICD-10-CM | POA: Diagnosis not present

## 2022-09-12 DIAGNOSIS — Z1389 Encounter for screening for other disorder: Secondary | ICD-10-CM | POA: Diagnosis not present

## 2022-09-13 DIAGNOSIS — Z23 Encounter for immunization: Secondary | ICD-10-CM | POA: Diagnosis not present

## 2022-09-16 DIAGNOSIS — E039 Hypothyroidism, unspecified: Secondary | ICD-10-CM | POA: Diagnosis not present

## 2022-09-16 DIAGNOSIS — R7309 Other abnormal glucose: Secondary | ICD-10-CM | POA: Diagnosis not present

## 2022-09-16 DIAGNOSIS — E782 Mixed hyperlipidemia: Secondary | ICD-10-CM | POA: Diagnosis not present

## 2022-10-04 ENCOUNTER — Other Ambulatory Visit: Payer: Self-pay | Admitting: Family Medicine

## 2022-10-04 DIAGNOSIS — N952 Postmenopausal atrophic vaginitis: Secondary | ICD-10-CM | POA: Diagnosis not present

## 2022-10-04 DIAGNOSIS — R1013 Epigastric pain: Secondary | ICD-10-CM

## 2022-10-04 DIAGNOSIS — Z8673 Personal history of transient ischemic attack (TIA), and cerebral infarction without residual deficits: Secondary | ICD-10-CM | POA: Diagnosis not present

## 2022-10-04 DIAGNOSIS — Z6827 Body mass index (BMI) 27.0-27.9, adult: Secondary | ICD-10-CM | POA: Diagnosis not present

## 2022-10-04 DIAGNOSIS — I10 Essential (primary) hypertension: Secondary | ICD-10-CM | POA: Diagnosis not present

## 2022-10-04 DIAGNOSIS — E782 Mixed hyperlipidemia: Secondary | ICD-10-CM | POA: Diagnosis not present

## 2022-10-04 DIAGNOSIS — R7303 Prediabetes: Secondary | ICD-10-CM | POA: Diagnosis not present

## 2022-10-04 DIAGNOSIS — Z23 Encounter for immunization: Secondary | ICD-10-CM | POA: Diagnosis not present

## 2022-10-04 DIAGNOSIS — E039 Hypothyroidism, unspecified: Secondary | ICD-10-CM | POA: Diagnosis not present

## 2022-10-27 ENCOUNTER — Ambulatory Visit
Admission: RE | Admit: 2022-10-27 | Discharge: 2022-10-27 | Disposition: A | Discharge status: Home / Self Care | Payer: Medicare Other | Source: Ambulatory Visit | Attending: Family Medicine | Admitting: Family Medicine

## 2022-10-27 DIAGNOSIS — R1013 Epigastric pain: Secondary | ICD-10-CM | POA: Diagnosis not present

## 2022-11-06 ENCOUNTER — Inpatient Hospital Stay (HOSPITAL_COMMUNITY)
Admission: EM | Admit: 2022-11-06 | Discharge: 2022-11-10 | DRG: 337 | Disposition: A | Discharge status: Home / Self Care | Payer: Medicare Other | Attending: Surgery | Admitting: Surgery

## 2022-11-06 ENCOUNTER — Emergency Department (HOSPITAL_COMMUNITY): Payer: Medicare Other

## 2022-11-06 DIAGNOSIS — Z7902 Long term (current) use of antithrombotics/antiplatelets: Secondary | ICD-10-CM

## 2022-11-06 DIAGNOSIS — R109 Unspecified abdominal pain: Secondary | ICD-10-CM | POA: Diagnosis not present

## 2022-11-06 DIAGNOSIS — K5651 Intestinal adhesions [bands], with partial obstruction: Secondary | ICD-10-CM | POA: Diagnosis not present

## 2022-11-06 DIAGNOSIS — E559 Vitamin D deficiency, unspecified: Secondary | ICD-10-CM | POA: Diagnosis present

## 2022-11-06 DIAGNOSIS — K469 Unspecified abdominal hernia without obstruction or gangrene: Secondary | ICD-10-CM | POA: Diagnosis present

## 2022-11-06 DIAGNOSIS — E785 Hyperlipidemia, unspecified: Secondary | ICD-10-CM | POA: Diagnosis present

## 2022-11-06 DIAGNOSIS — I700 Atherosclerosis of aorta: Secondary | ICD-10-CM | POA: Diagnosis not present

## 2022-11-06 DIAGNOSIS — Z9889 Other specified postprocedural states: Secondary | ICD-10-CM

## 2022-11-06 DIAGNOSIS — R1013 Epigastric pain: Secondary | ICD-10-CM | POA: Diagnosis not present

## 2022-11-06 DIAGNOSIS — E039 Hypothyroidism, unspecified: Secondary | ICD-10-CM | POA: Diagnosis present

## 2022-11-06 DIAGNOSIS — I10 Essential (primary) hypertension: Secondary | ICD-10-CM | POA: Diagnosis present

## 2022-11-06 DIAGNOSIS — Z90710 Acquired absence of both cervix and uterus: Secondary | ICD-10-CM | POA: Diagnosis not present

## 2022-11-06 DIAGNOSIS — R1010 Upper abdominal pain, unspecified: Principal | ICD-10-CM

## 2022-11-06 DIAGNOSIS — K565 Intestinal adhesions [bands], unspecified as to partial versus complete obstruction: Principal | ICD-10-CM | POA: Diagnosis present

## 2022-11-06 DIAGNOSIS — E876 Hypokalemia: Secondary | ICD-10-CM | POA: Diagnosis present

## 2022-11-06 DIAGNOSIS — Z8673 Personal history of transient ischemic attack (TIA), and cerebral infarction without residual deficits: Secondary | ICD-10-CM | POA: Diagnosis not present

## 2022-11-06 DIAGNOSIS — K660 Peritoneal adhesions (postprocedural) (postinfection): Secondary | ICD-10-CM | POA: Diagnosis not present

## 2022-11-06 LAB — CBC WITH DIFFERENTIAL/PLATELET
Abs Immature Granulocytes: 0.07 10*3/uL (ref 0.00–0.07)
Basophils Absolute: 0.1 10*3/uL (ref 0.0–0.1)
Basophils Relative: 0 %
Eosinophils Absolute: 0 10*3/uL (ref 0.0–0.5)
Eosinophils Relative: 0 %
HCT: 47.2 % — ABNORMAL HIGH (ref 36.0–46.0)
Hemoglobin: 15.6 g/dL — ABNORMAL HIGH (ref 12.0–15.0)
Immature Granulocytes: 1 %
Lymphocytes Relative: 10 %
Lymphs Abs: 1.3 10*3/uL (ref 0.7–4.0)
MCH: 30.1 pg (ref 26.0–34.0)
MCHC: 33.1 g/dL (ref 30.0–36.0)
MCV: 91.1 fL (ref 80.0–100.0)
Monocytes Absolute: 0.9 10*3/uL (ref 0.1–1.0)
Monocytes Relative: 6 %
Neutro Abs: 11.4 10*3/uL — ABNORMAL HIGH (ref 1.7–7.7)
Neutrophils Relative %: 83 %
Platelets: 193 10*3/uL (ref 150–400)
RBC: 5.18 MIL/uL — ABNORMAL HIGH (ref 3.87–5.11)
RDW: 12.9 % (ref 11.5–15.5)
WBC: 13.7 10*3/uL — ABNORMAL HIGH (ref 4.0–10.5)
nRBC: 0 % (ref 0.0–0.2)

## 2022-11-06 LAB — COMPREHENSIVE METABOLIC PANEL
ALT: 25 U/L (ref 0–44)
AST: 55 U/L — ABNORMAL HIGH (ref 15–41)
Albumin: 5 g/dL (ref 3.5–5.0)
Alkaline Phosphatase: 66 U/L (ref 38–126)
Anion gap: 14 (ref 5–15)
BUN: 16 mg/dL (ref 8–23)
CO2: 24 mmol/L (ref 22–32)
Calcium: 9.6 mg/dL (ref 8.9–10.3)
Chloride: 98 mmol/L (ref 98–111)
Creatinine, Ser: 0.63 mg/dL (ref 0.44–1.00)
GFR, Estimated: >60 mL/min (ref >60)
Glucose, Bld: 167 mg/dL — ABNORMAL HIGH (ref 70–99)
Potassium: 3.3 mmol/L — ABNORMAL LOW (ref 3.5–5.1)
Sodium: 136 mmol/L (ref 135–145)
Total Bilirubin: 1.1 mg/dL (ref 0.3–1.2)
Total Protein: 8.2 g/dL — ABNORMAL HIGH (ref 6.5–8.1)

## 2022-11-06 LAB — LIPASE, BLOOD: Lipase: 37 U/L (ref 11–51)

## 2022-11-06 MED ORDER — HYDROMORPHONE HCL 1 MG/ML IJ SOLN
1.0000 mg | Freq: Once | INTRAMUSCULAR | Status: AC
  Administered 2022-11-06: 1 mg via INTRAVENOUS
  Filled 2022-11-06: qty 1

## 2022-11-06 MED ORDER — IOHEXOL 300 MG/ML  SOLN
100.0000 mL | Freq: Once | INTRAMUSCULAR | Status: AC | PRN
  Administered 2022-11-07: 100 mL via INTRAVENOUS

## 2022-11-06 MED ORDER — POTASSIUM CHLORIDE CRYS ER 20 MEQ PO TBCR
20.0000 meq | EXTENDED_RELEASE_TABLET | Freq: Once | ORAL | Status: DC
  Filled 2022-11-06: qty 1

## 2022-11-06 MED ORDER — MORPHINE SULFATE (PF) 4 MG/ML IV SOLN
4.0000 mg | Freq: Once | INTRAVENOUS | Status: AC
  Administered 2022-11-06: 4 mg via INTRAVENOUS
  Filled 2022-11-06: qty 1

## 2022-11-06 MED ORDER — ONDANSETRON HCL 4 MG/2ML IJ SOLN
4.0000 mg | Freq: Once | INTRAMUSCULAR | Status: AC
  Administered 2022-11-06: 4 mg via INTRAVENOUS
  Filled 2022-11-06: qty 2

## 2022-11-06 MED ORDER — SODIUM CHLORIDE 0.9 % IV BOLUS
1000.0000 mL | Freq: Once | INTRAVENOUS | Status: AC
  Administered 2022-11-06: 1000 mL via INTRAVENOUS

## 2022-11-06 NOTE — ED Triage Notes (Signed)
Pt here from home with c/o abd pain that started around 6 pm this evening , pt has ERCP coming up soon , also some n/v

## 2022-11-06 NOTE — ED Provider Notes (Signed)
[place] [facility] Provider Note   CSN: [id number] Arrival date & time: 11/06/22  2043     History  Chief Complaint  Patient presents with   Abdominal Pain    Debra Herman is a 77 y.o. female with a past medical history significant for hypothyroidism, hypertension, history of TIA on Plavix, vitamin D deficiency, and hyperlipidemia who presents to the ED due to upper abdominal pain that radiates to back that started tonight around 6PM.  Patient describes pain as a bandlike tightening sensation across her upper abdomen that radiates to back.  Has a dilated common bile duct on [place] on 1/11.  Has an appointment with [facility] in February.  Patient notes she is supposed to get an endoscopy for removal of stone at that time.  Patient admits to numerous episodes of nonbloody, nonbilious emesis.  Denies chest pain and shortness of breath. Denies alcohol and chronic NSAID use.  Denies hematochezia, melena, and hematemesis.  History obtained from patient and past medical records. No interpreter used during encounter.       Home Medications Prior to Admission medications   Not on File      Allergies    Patient has no allergy information on record.    Review of Systems   Review of Systems  Constitutional:  Negative for chills and fever.  Respiratory:  Negative for shortness of breath.   Cardiovascular:  Negative for chest pain.  Gastrointestinal:  Positive for abdominal pain, nausea and vomiting. Negative for diarrhea.  Genitourinary:  Negative for dysuria.  All other systems reviewed and are negative.   Physical Exam Updated Vital Signs BP (!) 175/73   Pulse (!) 56   Temp 98.5 F (36.9 C) (Oral)   Resp 17   LMP  (LMP Unknown)   SpO2 99%  Physical Exam Vitals and nursing note reviewed.  Constitutional:      General: She is not in acute distress.    Appearance: She is not ill-appearing.     Comments: Appears uncomfortable in bed  HENT:      Head: Normocephalic.  Eyes:     Pupils: Pupils are equal, round, and reactive to light.  Cardiovascular:     Rate and Rhythm: Normal rate and regular rhythm.     Pulses: Normal pulses.     Heart sounds: Normal heart sounds. No murmur heard.    No friction rub. No gallop.  Pulmonary:     Effort: Pulmonary effort is normal.     Breath sounds: Normal breath sounds.  Abdominal:     General: Abdomen is flat. There is no distension.     Palpations: Abdomen is soft.     Tenderness: There is abdominal tenderness. There is no guarding or rebound.     Comments: Epigastric and right upper quadrant tenderness  Musculoskeletal:        General: Normal range of motion.     Cervical back: Neck supple.  Skin:    General: Skin is warm and dry.  Neurological:     General: No focal deficit present.     Mental Status: She is alert.  Psychiatric:        Mood and Affect: Mood normal.        Behavior: Behavior normal.     ED Results / Procedures / Treatments   Labs (all labs ordered are listed, but only abnormal results are displayed) Labs Reviewed  CBC WITH DIFFERENTIAL/PLATELET - Abnormal; Notable for the  following components:      Result Value   WBC 13.7 (*)    RBC 5.18 (*)    Hemoglobin 15.6 (*)    HCT 47.2 (*)    Neutro Abs 11.4 (*)    All other components within normal limits  COMPREHENSIVE METABOLIC PANEL - Abnormal; Notable for the following components:   Potassium 3.3 (*)    Glucose, Bld 167 (*)    Total Protein 8.2 (*)    AST 55 (*)    All other components within normal limits  LIPASE, BLOOD  URINALYSIS, ROUTINE W REFLEX MICROSCOPIC    EKG EKG Interpretation  Date/Time:  Sunday November 06 2022 21:41:09 EST Ventricular Rate:  52 PR Interval:  191 QRS Duration: 99 QT Interval:  473 QTC Calculation: 440 R Axis:   53 Text Interpretation: Sinus rhythm Low voltage, precordial leads No significant change since last tracing Confirmed by Wandra Arthurs [phone]) on 11/06/2022  9:44:07 PM  Radiology No results found.  Procedures Procedures    Medications Ordered in ED Medications  potassium chloride SA (KLOR-CON M) CR tablet 20 mEq (has no administration in time range)  morphine (PF) 4 MG/ML injection 4 mg (4 mg Intravenous Given 11/06/22 2201)  ondansetron (ZOFRAN) injection 4 mg (4 mg Intravenous Given 11/06/22 2201)  sodium chloride 0.9 % bolus 1,000 mL (1,000 mLs Intravenous New Bag/Given 11/06/22 2204)  HYDROmorphone (DILAUDID) injection 1 mg (1 mg Intravenous Given 11/06/22 2236)    ED Course/ Medical Decision Making/ A&P Clinical Course as of 11/06/22 2326  Sun Nov 06, 2022  2256 WBC(!): 13.7 [CA]  2256 Hemoglobin(!): 15.6 [CA]  2324 Potassium(!): 3.3 [CA]  2324 AST(!): 55 [CA]    Clinical Course User Index [CA] Suzy Bouchard, PA-C                             Medical Decision Making Amount and/or Complexity of Data Reviewed Independent Historian: spouse External Data Reviewed: notes. Labs: ordered. Decision-making details documented in ED Course. Radiology: ordered and independent interpretation performed. Decision-making details documented in ED Course. ECG/medicine tests: ordered and independent interpretation performed. Decision-making details documented in ED Course.  Risk Prescription drug management.   This patient presents to the ED for concern of epigastric pain, this involves an extensive number of treatment options, and is a complaint that carries with it a high risk of complications and morbidity.  The differential diagnosis includes pancreatitis, acute cholecystitis, atypical ACS, etc  77 year old female presents to the ED due to upper abdominal pain associate with nausea and vomiting that started around 6 PM tonight.  Dilated common bile duct on [place] on 1/11 and has an appointment with [facility] in February. Hx HTN and HL. No chest pain or shortness of breath.  Upon arrival, stable vitals.  Patient appears uncomfortable in  bed.  Severe tenderness to epigastric and right upper quadrant.  IV fluids and Zofran given.  Abdominal labs ordered. Ct abdomen.  EKG to rule out atypical ACS.  EKG demonstrates normal sinus rhythm.  No signs of acute ischemia.  Low suspicion for atypical ACS.  CBC significant for leukocytosis at 13.7.  Elevated hemoglobin at 15.6.  Hemoconcentration?  Lipase normal at 37.  Low suspicion for pancreatitis.  CMP significant for hypokalemia 3.3.  Elevated AST of 55.  Normal renal function.  Patient handed off to Montine Circle, PA-C at shift change pending CT abdomen and reassessment to determine disposition.  Final Clinical Impression(s) / ED Diagnoses Final diagnoses:  Pain of upper abdomen    Rx / DC Orders ED Discharge Orders     None         Karie Kirks 11/06/22 2333    Drenda Freeze, MD 11/07/22 [phone]

## 2022-11-07 ENCOUNTER — Encounter (HOSPITAL_COMMUNITY): Admission: EM | Disposition: A | Discharge status: Home / Self Care | Payer: Self-pay | Source: Home / Self Care

## 2022-11-07 ENCOUNTER — Emergency Department (HOSPITAL_COMMUNITY): Payer: Medicare Other | Admitting: Certified Registered Nurse Anesthetist

## 2022-11-07 DIAGNOSIS — R109 Unspecified abdominal pain: Secondary | ICD-10-CM | POA: Diagnosis not present

## 2022-11-07 DIAGNOSIS — E039 Hypothyroidism, unspecified: Secondary | ICD-10-CM | POA: Diagnosis present

## 2022-11-07 DIAGNOSIS — Z7902 Long term (current) use of antithrombotics/antiplatelets: Secondary | ICD-10-CM | POA: Diagnosis not present

## 2022-11-07 DIAGNOSIS — I700 Atherosclerosis of aorta: Secondary | ICD-10-CM | POA: Diagnosis not present

## 2022-11-07 DIAGNOSIS — K660 Peritoneal adhesions (postprocedural) (postinfection): Secondary | ICD-10-CM | POA: Diagnosis not present

## 2022-11-07 DIAGNOSIS — K469 Unspecified abdominal hernia without obstruction or gangrene: Secondary | ICD-10-CM

## 2022-11-07 DIAGNOSIS — R1010 Upper abdominal pain, unspecified: Secondary | ICD-10-CM | POA: Diagnosis present

## 2022-11-07 DIAGNOSIS — K5651 Intestinal adhesions [bands], with partial obstruction: Secondary | ICD-10-CM | POA: Diagnosis not present

## 2022-11-07 DIAGNOSIS — Z90710 Acquired absence of both cervix and uterus: Secondary | ICD-10-CM | POA: Diagnosis not present

## 2022-11-07 DIAGNOSIS — K565 Intestinal adhesions [bands], unspecified as to partial versus complete obstruction: Secondary | ICD-10-CM | POA: Diagnosis present

## 2022-11-07 DIAGNOSIS — E876 Hypokalemia: Secondary | ICD-10-CM | POA: Diagnosis present

## 2022-11-07 DIAGNOSIS — Z8673 Personal history of transient ischemic attack (TIA), and cerebral infarction without residual deficits: Secondary | ICD-10-CM | POA: Diagnosis not present

## 2022-11-07 DIAGNOSIS — E559 Vitamin D deficiency, unspecified: Secondary | ICD-10-CM | POA: Diagnosis present

## 2022-11-07 DIAGNOSIS — E785 Hyperlipidemia, unspecified: Secondary | ICD-10-CM | POA: Diagnosis present

## 2022-11-07 DIAGNOSIS — I10 Essential (primary) hypertension: Secondary | ICD-10-CM | POA: Diagnosis present

## 2022-11-07 DIAGNOSIS — Z9889 Other specified postprocedural states: Secondary | ICD-10-CM

## 2022-11-07 HISTORY — PX: LAPAROTOMY: SHX154

## 2022-11-07 LAB — CBC
HCT: 48.1 % — ABNORMAL HIGH (ref 36.0–46.0)
Hemoglobin: 15.1 g/dL — ABNORMAL HIGH (ref 12.0–15.0)
MCH: 29.7 pg (ref 26.0–34.0)
MCHC: 31.4 g/dL (ref 30.0–36.0)
MCV: 94.7 fL (ref 80.0–100.0)
Platelets: 177 10*3/uL (ref 150–400)
RBC: 5.08 MIL/uL (ref 3.87–5.11)
RDW: 12.9 % (ref 11.5–15.5)
WBC: 10.4 10*3/uL (ref 4.0–10.5)
nRBC: 0 % (ref 0.0–0.2)

## 2022-11-07 LAB — BASIC METABOLIC PANEL
Anion gap: 10 (ref 5–15)
BUN: 13 mg/dL (ref 8–23)
CO2: 20 mmol/L — ABNORMAL LOW (ref 22–32)
Calcium: 8.4 mg/dL — ABNORMAL LOW (ref 8.9–10.3)
Chloride: 105 mmol/L (ref 98–111)
Creatinine, Ser: 0.64 mg/dL (ref 0.44–1.00)
GFR, Estimated: >60 mL/min (ref >60)
Glucose, Bld: 160 mg/dL — ABNORMAL HIGH (ref 70–99)
Potassium: 3.8 mmol/L (ref 3.5–5.1)
Sodium: 135 mmol/L (ref 135–145)

## 2022-11-07 LAB — URINALYSIS, ROUTINE W REFLEX MICROSCOPIC
Bilirubin Urine: NEGATIVE
Glucose, UA: NEGATIVE mg/dL
Hgb urine dipstick: NEGATIVE
Ketones, ur: 5 mg/dL — AB
Nitrite: NEGATIVE
Protein, ur: NEGATIVE mg/dL
Specific Gravity, Urine: 1.015 (ref 1.005–1.030)
pH: 7 (ref 5.0–8.0)

## 2022-11-07 LAB — TYPE AND SCREEN
ABO/RH(D): A POS
Antibody Screen: NEGATIVE

## 2022-11-07 LAB — LACTIC ACID, PLASMA
Lactic Acid, Venous: 2.2 mmol/L (ref 0.5–1.9)
Lactic Acid, Venous: 3.1 mmol/L (ref 0.5–1.9)

## 2022-11-07 LAB — ABO/RH: ABO/RH(D): A POS

## 2022-11-07 SURGERY — LAPAROTOMY, EXPLORATORY
Anesthesia: General

## 2022-11-07 MED ORDER — ROCURONIUM BROMIDE 10 MG/ML (PF) SYRINGE
PREFILLED_SYRINGE | INTRAVENOUS | Status: DC | PRN
  Administered 2022-11-07: 30 mg via INTRAVENOUS

## 2022-11-07 MED ORDER — ROCURONIUM BROMIDE 10 MG/ML (PF) SYRINGE
PREFILLED_SYRINGE | INTRAVENOUS | Status: AC
  Filled 2022-11-07: qty 10

## 2022-11-07 MED ORDER — OXYCODONE HCL 5 MG PO TABS: 5.0000 mg | ORAL_TABLET | ORAL | Status: DC | PRN

## 2022-11-07 MED ORDER — DEXAMETHASONE SODIUM PHOSPHATE 10 MG/ML IJ SOLN
INTRAMUSCULAR | Status: AC
  Filled 2022-11-07: qty 1

## 2022-11-07 MED ORDER — HYDROCHLOROTHIAZIDE 12.5 MG PO TABS
6.2500 mg | ORAL_TABLET | Freq: Every day | ORAL | Status: DC
  Administered 2022-11-07 – 2022-11-10 (×4): 6.25 mg via ORAL
  Filled 2022-11-07 (×4): qty 1

## 2022-11-07 MED ORDER — MIDAZOLAM HCL 2 MG/2ML IJ SOLN
INTRAMUSCULAR | Status: AC
  Filled 2022-11-07: qty 2

## 2022-11-07 MED ORDER — PROPOFOL 10 MG/ML IV BOLUS
INTRAVENOUS | Status: AC
  Filled 2022-11-07: qty 20

## 2022-11-07 MED ORDER — ONDANSETRON HCL 4 MG/2ML IJ SOLN
INTRAMUSCULAR | Status: AC
  Filled 2022-11-07: qty 2

## 2022-11-07 MED ORDER — ACETAMINOPHEN 500 MG PO TABS
1000.0000 mg | ORAL_TABLET | Freq: Four times a day (QID) | ORAL | Status: DC
  Administered 2022-11-07 – 2022-11-10 (×10): 1000 mg via ORAL
  Filled 2022-11-07 (×12): qty 2

## 2022-11-07 MED ORDER — EPHEDRINE SULFATE-NACL 50-0.9 MG/10ML-% IV SOSY
PREFILLED_SYRINGE | INTRAVENOUS | Status: DC | PRN
  Administered 2022-11-07: 10 mg via INTRAVENOUS

## 2022-11-07 MED ORDER — METHOCARBAMOL 500 MG IVPB - SIMPLE MED
500.0000 mg | Freq: Three times a day (TID) | INTRAVENOUS | Status: DC | PRN
  Administered 2022-11-07: 500 mg via INTRAVENOUS

## 2022-11-07 MED ORDER — FENTANYL CITRATE (PF) 100 MCG/2ML IJ SOLN
INTRAMUSCULAR | Status: DC | PRN
  Administered 2022-11-07 (×2): 50 ug via INTRAVENOUS

## 2022-11-07 MED ORDER — HYDROMORPHONE HCL 1 MG/ML IJ SOLN
0.5000 mg | INTRAMUSCULAR | Status: DC | PRN
  Filled 2022-11-07: qty 0.5

## 2022-11-07 MED ORDER — SUGAMMADEX SODIUM 500 MG/5ML IV SOLN
INTRAVENOUS | Status: DC | PRN
  Administered 2022-11-07: 300 mg via INTRAVENOUS

## 2022-11-07 MED ORDER — DEXAMETHASONE SODIUM PHOSPHATE 10 MG/ML IJ SOLN
INTRAMUSCULAR | Status: DC | PRN
  Administered 2022-11-07: 10 mg via INTRAVENOUS

## 2022-11-07 MED ORDER — AMISULPRIDE (ANTIEMETIC) 5 MG/2ML IV SOLN: 10.0000 mg | Freq: Once | INTRAVENOUS | Status: DC | PRN

## 2022-11-07 MED ORDER — PHENYLEPHRINE 80 MCG/ML (10ML) SYRINGE FOR IV PUSH (FOR BLOOD PRESSURE SUPPORT)
PREFILLED_SYRINGE | INTRAVENOUS | Status: DC | PRN
  Administered 2022-11-07 (×2): 80 ug via INTRAVENOUS

## 2022-11-07 MED ORDER — SUCCINYLCHOLINE CHLORIDE 200 MG/10ML IV SOSY
PREFILLED_SYRINGE | INTRAVENOUS | Status: DC | PRN
  Administered 2022-11-07: 100 mg via INTRAVENOUS

## 2022-11-07 MED ORDER — HYDROMORPHONE HCL 1 MG/ML IJ SOLN
INTRAMUSCULAR | Status: AC
  Filled 2022-11-07: qty 1

## 2022-11-07 MED ORDER — MEPERIDINE HCL 50 MG/ML IJ SOLN: 6.2500 mg | INTRAMUSCULAR | Status: DC | PRN

## 2022-11-07 MED ORDER — LEVOTHYROXINE SODIUM 75 MCG PO TABS
75.0000 ug | ORAL_TABLET | Freq: Every day | ORAL | Status: DC
  Administered 2022-11-07 – 2022-11-10 (×4): 75 ug via ORAL
  Filled 2022-11-07 (×4): qty 1

## 2022-11-07 MED ORDER — SUCCINYLCHOLINE CHLORIDE 200 MG/10ML IV SOSY
PREFILLED_SYRINGE | INTRAVENOUS | Status: AC
  Filled 2022-11-07: qty 10

## 2022-11-07 MED ORDER — ONDANSETRON 4 MG PO TBDP: 4.0000 mg | ORAL_TABLET | Freq: Four times a day (QID) | ORAL | Status: DC | PRN

## 2022-11-07 MED ORDER — METHOCARBAMOL 500 MG IVPB - SIMPLE MED
INTRAVENOUS | Status: AC
  Filled 2022-11-07: qty 55

## 2022-11-07 MED ORDER — PROCHLORPERAZINE EDISYLATE 10 MG/2ML IJ SOLN
10.0000 mg | INTRAMUSCULAR | Status: DC | PRN
  Administered 2022-11-07: 10 mg via INTRAVENOUS
  Filled 2022-11-07: qty 2

## 2022-11-07 MED ORDER — LIDOCAINE 2% (20 MG/ML) 5 ML SYRINGE
INTRAMUSCULAR | Status: DC | PRN
  Administered 2022-11-07: 60 mg via INTRAVENOUS

## 2022-11-07 MED ORDER — SODIUM CHLORIDE 0.9 % IV SOLN: INTRAVENOUS | Status: DC

## 2022-11-07 MED ORDER — FENTANYL CITRATE (PF) 100 MCG/2ML IJ SOLN
INTRAMUSCULAR | Status: AC
  Filled 2022-11-07: qty 2

## 2022-11-07 MED ORDER — LACTATED RINGERS IV SOLN: INTRAVENOUS | Status: DC | PRN

## 2022-11-07 MED ORDER — ENOXAPARIN SODIUM 40 MG/0.4ML IJ SOSY
40.0000 mg | PREFILLED_SYRINGE | INTRAMUSCULAR | Status: DC
  Administered 2022-11-07 – 2022-11-08 (×2): 40 mg via SUBCUTANEOUS
  Filled 2022-11-07 (×3): qty 0.4

## 2022-11-07 MED ORDER — METHOCARBAMOL 500 MG PO TABS
500.0000 mg | ORAL_TABLET | Freq: Three times a day (TID) | ORAL | Status: DC | PRN
  Filled 2022-11-07: qty 1

## 2022-11-07 MED ORDER — BISOPROLOL FUMARATE 5 MG PO TABS
5.0000 mg | ORAL_TABLET | Freq: Every day | ORAL | Status: DC
  Administered 2022-11-07 – 2022-11-10 (×4): 5 mg via ORAL
  Filled 2022-11-07 (×4): qty 1

## 2022-11-07 MED ORDER — PROPOFOL 10 MG/ML IV BOLUS
INTRAVENOUS | Status: DC | PRN
  Administered 2022-11-07: 100 mg via INTRAVENOUS

## 2022-11-07 MED ORDER — CEFAZOLIN SODIUM-DEXTROSE 2-4 GM/100ML-% IV SOLN
INTRAVENOUS | Status: AC
  Filled 2022-11-07: qty 100

## 2022-11-07 MED ORDER — ONDANSETRON HCL 4 MG/2ML IJ SOLN
INTRAMUSCULAR | Status: DC | PRN
  Administered 2022-11-07: 4 mg via INTRAVENOUS

## 2022-11-07 MED ORDER — MIDAZOLAM HCL 5 MG/5ML IJ SOLN
INTRAMUSCULAR | Status: DC | PRN
  Administered 2022-11-07: 2 mg via INTRAVENOUS

## 2022-11-07 MED ORDER — CEFAZOLIN SODIUM-DEXTROSE 2-4 GM/100ML-% IV SOLN
2.0000 g | Freq: Once | INTRAVENOUS | Status: AC
  Administered 2022-11-07: 2 g via INTRAVENOUS
  Filled 2022-11-07 (×2): qty 100

## 2022-11-07 MED ORDER — BISOPROLOL-HYDROCHLOROTHIAZIDE 5-6.25 MG PO TABS: 1.0000 | ORAL_TABLET | Freq: Every day | ORAL | Status: DC

## 2022-11-07 MED ORDER — ONDANSETRON HCL 4 MG/2ML IJ SOLN
4.0000 mg | Freq: Four times a day (QID) | INTRAMUSCULAR | Status: DC | PRN
  Administered 2022-11-07: 4 mg via INTRAVENOUS
  Filled 2022-11-07 (×2): qty 2

## 2022-11-07 MED ORDER — SODIUM CHLORIDE 0.9 % IR SOLN
Status: DC | PRN
  Administered 2022-11-07 (×2): 1000 mL

## 2022-11-07 MED ORDER — SIMETHICONE 80 MG PO CHEW: 40.0000 mg | CHEWABLE_TABLET | Freq: Four times a day (QID) | ORAL | Status: DC | PRN

## 2022-11-07 MED ORDER — PHENYLEPHRINE 80 MCG/ML (10ML) SYRINGE FOR IV PUSH (FOR BLOOD PRESSURE SUPPORT)
PREFILLED_SYRINGE | INTRAVENOUS | Status: AC
  Filled 2022-11-07: qty 10

## 2022-11-07 MED ORDER — CHLORHEXIDINE GLUCONATE CLOTH 2 % EX PADS
6.0000 | MEDICATED_PAD | Freq: Every day | CUTANEOUS | Status: DC
  Administered 2022-11-07: 6 via TOPICAL

## 2022-11-07 MED ORDER — HYDROMORPHONE HCL 1 MG/ML IJ SOLN
0.2500 mg | INTRAMUSCULAR | Status: DC | PRN
  Administered 2022-11-07 (×3): 0.5 mg via INTRAVENOUS

## 2022-11-07 MED ORDER — SODIUM CHLORIDE 0.9 % IV SOLN: Freq: Once | INTRAVENOUS | Status: AC

## 2022-11-07 MED ORDER — EPHEDRINE 5 MG/ML INJ
INTRAVENOUS | Status: AC
  Filled 2022-11-07: qty 5

## 2022-11-07 MED ORDER — LIDOCAINE HCL (PF) 2 % IJ SOLN
INTRAMUSCULAR | Status: AC
  Filled 2022-11-07: qty 5

## 2022-11-07 SURGICAL SUPPLY — 34 items
BAG COUNTER SPONGE SURGICOUNT (BAG) IMPLANT
BLADE EXTENDED COATED 6.5IN (ELECTRODE) IMPLANT
CHLORAPREP W/TINT 26 (MISCELLANEOUS) ×1 IMPLANT
COVER MAYO STAND STRL (DRAPES) ×3 IMPLANT
COVER SURGICAL LIGHT HANDLE (MISCELLANEOUS) ×1 IMPLANT
DRAPE LAPAROSCOPIC ABDOMINAL (DRAPES) ×1 IMPLANT
DRSG OPSITE POSTOP 4X10 (GAUZE/BANDAGES/DRESSINGS) IMPLANT
DRSG OPSITE POSTOP 4X6 (GAUZE/BANDAGES/DRESSINGS) IMPLANT
DRSG OPSITE POSTOP 4X8 (GAUZE/BANDAGES/DRESSINGS) IMPLANT
ELECT REM PT RETURN 15FT ADLT (MISCELLANEOUS) ×1 IMPLANT
GAUZE SPONGE 4X4 12PLY STRL (GAUZE/BANDAGES/DRESSINGS) IMPLANT
GLOVE BIO SURGEON STRL SZ7 (GLOVE) ×2 IMPLANT
GLOVE BIOGEL PI IND STRL 7.5 (GLOVE) ×2 IMPLANT
GOWN STRL REUS W/ TWL LRG LVL3 (GOWN DISPOSABLE) ×2 IMPLANT
GOWN STRL REUS W/TWL LRG LVL3 (GOWN DISPOSABLE) ×2
HANDLE SUCTION POOLE (INSTRUMENTS) IMPLANT
KIT TURNOVER KIT A (KITS) IMPLANT
PACK COLON (CUSTOM PROCEDURE TRAY) ×1 IMPLANT
PENCIL SMOKE EVACUATOR (MISCELLANEOUS) IMPLANT
STAPLER VISISTAT 35W (STAPLE) ×1 IMPLANT
SUCTION POOLE HANDLE (INSTRUMENTS)
SUT NOVA NAB GS-21 0 18 T12 DT (SUTURE) IMPLANT
SUT NOVA NAB GS-21 1 T12 (SUTURE) IMPLANT
SUT PDS AB 1 TP1 96 (SUTURE) IMPLANT
SUT SILK 2 0 (SUTURE) ×1
SUT SILK 2 0 SH CR/8 (SUTURE) ×1 IMPLANT
SUT SILK 2-0 18XBRD TIE 12 (SUTURE) ×1 IMPLANT
SUT SILK 3 0 (SUTURE)
SUT SILK 3 0 SH CR/8 (SUTURE) ×1 IMPLANT
SUT SILK 3-0 18XBRD TIE 12 (SUTURE) ×1 IMPLANT
TOWEL OR 17X26 10 PK STRL BLUE (TOWEL DISPOSABLE) IMPLANT
TOWEL OR NON WOVEN STRL DISP B (DISPOSABLE) ×1 IMPLANT
TRAY FOLEY MTR SLVR 14FR STAT (SET/KITS/TRAYS/PACK) IMPLANT
TRAY FOLEY MTR SLVR 16FR STAT (SET/KITS/TRAYS/PACK) IMPLANT

## 2022-11-07 NOTE — Progress Notes (Signed)
Daughter Gae Bon) and husband both called and given an update on pt's condition. Maxwell Caul, PA spoke with daughter as well this AM and spoke at length regarding pt's procedure and post-op expectations. Both phone calls were transferred to pt's room after all questions answered.

## 2022-11-07 NOTE — Progress Notes (Addendum)
Received orders from Dr. Donne Hazel to give 500 cc of NS one time  for critical lactic acid level and do not recheck lactic acid level after NS bolus.

## 2022-11-07 NOTE — Progress Notes (Signed)
Day of Surgery  Subjective: Patient with some nausea after sips of water and tylenol, but no emesis.  Pain seems fairly well-controlled for right out of OR.  Objective: Vital signs in last 24 hours: Temp:  [97.4 F (36.3 C)-98.5 F (36.9 C)] 97.9 F (36.6 C) (01/22 0802) Pulse Rate:  [53-73] 65 (01/22 0802) Resp:  [9-22] 14 (01/22 0802) BP: (104-175)/(51-94) 121/57 (01/22 0802) SpO2:  [91 %-99 %] 96 % (01/22 0802) Last BM Date : 11/06/22  Intake/Output from previous day: 01/21 0701 - 01/22 0700 In: 2250 [I.V.:1000; IV Piggyback:1150] Out: 410 [Urine:400; Blood:10] Intake/Output this shift: Total I/O In: 0  Out: 200 [Urine:200]  PE: Gen: NAD Heart: regular Lungs: CTAB, on O2 via [place] Abd: soft, appropriately tender, quiet, incision c/d/I with some bloody drainage noted on honeycomb, otherwise staples intact.  Lab Results:  Recent Labs    11/06/22 2205 11/07/22 0502  WBC 13.7* 10.4  HGB 15.6* 15.1*  HCT 47.2* 48.1*  PLT 193 177   BMET Recent Labs    11/06/22 2205 11/07/22 0502  NA 136 135  K 3.3* 3.8  CL 98 105  CO2 24 20*  GLUCOSE 167* 160*  BUN 16 13  CREATININE 0.63 0.64  CALCIUM 9.6 8.4*   PT/INR No results for input(s): "LABPROT", "INR" in the last 72 hours. CMP     Component Value Date/Time   NA 135 11/07/2022 0502   K 3.8 11/07/2022 0502   CL 105 11/07/2022 0502   CO2 20 (L) 11/07/2022 0502   GLUCOSE 160 (H) 11/07/2022 0502   BUN 13 11/07/2022 0502   CREATININE 0.64 11/07/2022 0502   CALCIUM 8.4 (L) 11/07/2022 0502   PROT 8.2 (H) 11/06/2022 2205   ALBUMIN 5.0 11/06/2022 2205   AST 55 (H) 11/06/2022 2205   ALT 25 11/06/2022 2205   ALKPHOS 66 11/06/2022 2205   BILITOT 1.1 11/06/2022 2205   GFRNONAA >60 11/07/2022 0502   GFRAA  09/30/2009 0905    >60        The eGFR has been calculated using the MDRD equation. This calculation has not been validated in all clinical situations. eGFR's persistently <60 mL/min signify possible  Chronic Kidney Disease.   Lipase     Component Value Date/Time   LIPASE 37 11/06/2022 2205       Studies/Results: CT ABDOMEN PELVIS W CONTRAST  Result Date: 11/07/2022 CLINICAL DATA:  Abdominal pain. EXAM: CT ABDOMEN AND PELVIS WITH CONTRAST TECHNIQUE: Multidetector CT imaging of the abdomen and pelvis was performed using the standard protocol following bolus administration of intravenous contrast. RADIATION DOSE REDUCTION: This exam was performed according to the departmental dose-optimization program which includes automated exposure control, adjustment of the mA and/or kV according to patient size and/or use of iterative reconstruction technique. CONTRAST:  154m OMNIPAQUE IOHEXOL 300 MG/ML  SOLN COMPARISON:  None Available. FINDINGS: Lower chest: No acute abnormality. Hepatobiliary: No focal liver abnormality is seen. No gallstones, gallbladder wall thickening, or biliary dilatation. Pancreas: Unremarkable. No pancreatic ductal dilatation or surrounding inflammatory changes. Spleen: Normal in size without focal abnormality. Adrenals/Urinary Tract: Adrenal glands are unremarkable. Kidneys are normal, without renal calculi, focal lesion, or hydronephrosis. Bladder is unremarkable. Stomach/Bowel: Stomach is within normal limits. The appendix is not identified. No evidence of bowel wall thickening, distention, or inflammatory changes. Twisting of the mesentery is seen within the midline of the lower abdomen, without evidence of associated bowel or vascular abnormality (axial CT images 50 through 58, CT series 2).  Vascular/Lymphatic: Aortic atherosclerosis. No enlarged abdominal or pelvic lymph nodes. Reproductive: Status post hysterectomy. No adnexal masses. Other: No abdominal wall hernia or abnormality. No abdominopelvic ascites. Musculoskeletal: Degenerative changes are seen at the level of L5-S1. IMPRESSION: 1. Mesenteric twisting within the midline of the lower abdomen, without evidence of  associated bowel or vascular abnormality. This may represent an internal hernia. 2. Aortic atherosclerosis. Aortic Atherosclerosis (ICD10-I70.0). Electronically Signed   By: Virgina Norfolk M.D.   On: 11/07/2022 00:21    Anti-infectives: Anti-infectives (From admission, onward)    Start     Dose/Rate Route Frequency Ordered Stop   11/07/22 0212  ceFAZolin (ANCEF) 2-4 GM/100ML-% IVPB       Note to Pharmacy: Nicholes Rough S: cabinet override      11/07/22 0212 11/07/22 0300   11/07/22 0130  ceFAZolin (ANCEF) IVPB 2g/100 mL premix        2 g 200 mL/hr over 30 Minutes Intravenous  Once 11/07/22 0120 11/07/22 0258        Assessment/Plan POD 0, s/p ex lap with LOA for internal hernia, Dr. Donne Hazel 11/07/22 -cont CLD today and hope to avoid NGT if able -cont foley today and plan to remove tomorrow -no postop abx  -mobilize/pulm toilet   FEN - CLD/IVFs VTE - lovenox ID - just pre-op dose  H/O TIA - plavix on hold   LOS: 0 days    Henreitta Cea , [facility] 11/07/2022, 9:35 AM Please see Amion for pager number during day hours 7:00am-4:30pm or 7:00am -11:30am on weekends

## 2022-11-07 NOTE — Anesthesia Preprocedure Evaluation (Addendum)
Anesthesia Evaluation  Patient identified by MRN, date of birth, ID band Patient awake    Reviewed: Allergy & Precautions, NPO status , Patient's Chart, lab work & pertinent test results  Airway Mallampati: II  TM Distance: >3 FB Neck ROM: Limited    Dental  (+) Dental Advisory Given, Teeth Intact   Pulmonary neg pulmonary ROS   Pulmonary exam normal breath sounds clear to auscultation       Cardiovascular negative cardio ROS  Rhythm:Regular Rate:Normal  Echo 2014 - Left ventricle: The cavity size was normal. There was mild    concentric hypertrophy. Systolic function was normal. The    estimated ejection fraction was in the range of 55% to    60%. Wall motion was normal; there were no regional wall    motion abnormalities.  - Aortic valve: Mild regurgitation.  - Mitral valve: Mild regurgitation.  - Left atrium: The atrium was mildly dilated.  - Pulmonary arteries: PA peak pressure: 21m Hg (S).     Neuro/Psych negative neurological ROS     GI/Hepatic negative GI ROS, Neg liver ROS,,,  Endo/Other  negative endocrine ROS    Renal/GU negative Renal ROS     Musculoskeletal negative musculoskeletal ROS (+)    Abdominal   Peds  Hematology negative hematology ROS (+)   Anesthesia Other Findings   Reproductive/Obstetrics                             Anesthesia Physical Anesthesia Plan  ASA: 3 and emergent  Anesthesia Plan: General   Post-op Pain Management: Ofirmev IV (intra-op)*   Induction: Intravenous, Rapid sequence and Cricoid pressure planned  PONV Risk Score and Plan: 4 or greater and Ondansetron, Dexamethasone and Treatment may vary due to age or medical condition  Airway Management Planned: Oral ETT  Additional Equipment:   Intra-op Plan:   Post-operative Plan: Possible Post-op intubation/ventilation  Informed Consent: I have reviewed the patients History and  Physical, chart, labs and discussed the procedure including the risks, benefits and alternatives for the proposed anesthesia with the patient or authorized representative who has indicated his/her understanding and acceptance.     Dental advisory given  Plan Discussed with: CRNA  Anesthesia Plan Comments:        Anesthesia Quick Evaluation

## 2022-11-07 NOTE — TOC CM/SW Note (Signed)
Transition of Care [facility]) Screening Note  Patient Details  Name: Debra Herman Date of Birth: [birth year]  Transition of Care [facility]) CM/SW Contact:    Sherie Don, LCSW Phone Number: 11/07/2022, 11:08 AM  Transition of Care Department [facility]) has reviewed patient and no TOC needs have been identified at this time. We will continue to monitor patient advancement through interdisciplinary progression rounds. If new patient transition needs arise, please place a TOC consult.

## 2022-11-07 NOTE — Op Note (Signed)
Preoperative diagnosis: Possible small bowel obstruction and internal hernia on CT scan Postoperative diagnosis: Same as above Procedure: Exploratory laparotomy with lysis of adhesions Surgeon: Dr. Serita Grammes Anesthesia: General Estimated blood loss: Minimal Specimens: None Drains: None Complications: None Sponge needle count was correct completion Disposition recovery stable addition  Indications: This is 77 year old female who has a prior history of a hysterectomy.  She had acute onset of abdominal pain in her upper upper abdomen associated with nausea.  She had an elevated white blood cell count.  She was mildly tender on her exam and she had a CT scan concerning for a internal hernia with twisting of her mesentery.  Due to my concern for ischemia to her bowel we discussed proceeding to the operating room for laparotomy urgently.  Procedure: After informed consent was obtained she was taken to the operating.  She was given antibiotics.  SCDs were in place.  She was placed under general anesthesia without complication.  She was prepped and draped in the standard sterile surgical fashion.  Surgical timeout was then performed.  I made a midline incision.  I carried this into the abdomen.  There was some fluid present.  I then was able to examine her bowel.  Her colon appeared normal.  I then noted some dilated small bowel as well as some that was injected.  I ran it from the ligament of Treitz and while doing this in the distal jejunum she was noted to have some adhesions with twisting of her bowel on these adhesions.  I used scissors to lyse these adhesions.  This straightened out the small bowel.  The small bowel was all viable and it did not need to be removed.  It had a palpable pulse in it as well.  I then ran this from the ligament of Treitz to the ileocecal valve a couple of times and there were no abnormalities.  There was no injury to the small bowel.  I then placed a small bowel back in  the abdomen.  I placed the omentum underneath the incision.  I then closed the incision with #1 looped PDS.  Hemostasis was observed of the incision.  I then closed this with staples and a dressing was placed.  She tolerated this well was extubated and transferred recovery stable.

## 2022-11-07 NOTE — Transfer of Care (Signed)
Immediate Anesthesia Transfer of Care Note  Patient: Debra Herman  Procedure(s) Performed: Procedure(s): EXPLORATORY LAPAROTOMY LYSIS OF ADHESIONS (N/A)  Patient Location: PACU  Anesthesia Type:General  Level of Consciousness: Alert, Awake, Oriented  Airway & Oxygen Therapy: Patient Spontanous Breathing  Post-op Assessment: Report given to RN  Post vital signs: Reviewed and stable  Last Vitals:  Vitals:   11/07/22 0200 11/07/22 0215  BP: (!) 121/54 (!) 104/51  Pulse: 69 70  Resp: (!) 22 19  Temp: 36.8 C   SpO2: 66% 06%    Complications: No apparent anesthesia complications

## 2022-11-07 NOTE — OR Nursing (Signed)
Critical lab called with lactic Acid 2.2 and Dr. Donne Hazel was called and reported critical lab.  No new orders at this time

## 2022-11-07 NOTE — Anesthesia Postprocedure Evaluation (Signed)
Anesthesia Post Note  Patient: Debra Herman  Procedure(s) Performed: EXPLORATORY LAPAROTOMY LYSIS OF ADHESIONS     Patient location during evaluation: PACU Anesthesia Type: General Level of consciousness: sedated and patient cooperative Pain management: pain level controlled Vital Signs Assessment: post-procedure vital signs reviewed and stable Respiratory status: spontaneous breathing Cardiovascular status: stable Anesthetic complications: no   No notable events documented.  Last Vitals:  Vitals:   11/07/22 1704 11/07/22 2036  BP: (!) 126/53 (!) 115/49  Pulse: 66 69  Resp: 14 16  Temp: 37.1 C 36.8 C  SpO2: 97% 94%    Last Pain:  Vitals:   11/07/22 2036  TempSrc: Oral  PainSc:                  Nolon Nations

## 2022-11-07 NOTE — Anesthesia Procedure Notes (Signed)
Procedure Name: Intubation Date/Time: 11/07/2022 2:36 AM  Performed by: Gerald Leitz, CRNAPre-anesthesia Checklist: Patient identified, Patient being monitored, Timeout performed, Emergency Drugs available and Suction available Patient Re-evaluated:Patient Re-evaluated prior to induction Oxygen Delivery Method: Circle system utilized Preoxygenation: Pre-oxygenation with 100% oxygen Induction Type: IV induction Ventilation: Mask ventilation without difficulty Laryngoscope Size: Mac and 3 Grade View: Grade I Tube type: Oral Tube size: 7.5 mm Number of attempts: 1 Airway Equipment and Method: Stylet Placement Confirmation: ETT inserted through vocal cords under direct vision, positive ETCO2 and breath sounds checked- equal and bilateral Secured at: 21 cm Tube secured with: Tape Dental Injury: Teeth and Oropharynx as per pre-operative assessment

## 2022-11-07 NOTE — Progress Notes (Signed)
Mobility Specialist - Progress Note   11/07/22 1538  Mobility  Activity Ambulated with assistance in room  Level of Assistance Contact guard assist, steadying assist  Assistive Device Other (Comment) (HHA)  Distance Ambulated (ft) 15 ft  Range of Motion/Exercises Active  Activity Response Tolerated well  Mobility Referral Yes  $Mobility charge 1 Mobility   Pt was found in bed and agreeable to ambulate. Stated having some abdominal soreness and at EOS was left on recliner chair with all necessities in reach. RN and NT notified.  Ferd Hibbs Mobility Specialist

## 2022-11-07 NOTE — H&P (Signed)
Debra Herman is an 77 y.o. female.   Chief Complaint: ab pain HPI: 72 yof who has a couple episodes of brief abdominal pain several months that resolved quickly.  She is on plavix for prior TIA and took this last night.  Today around 6 pm she had acute onset of bandlike upper abdominal pain that has not gone away. She feels somewhat better now. She remains nauseated.  She has no emesis, was having bowel function until this.  She has prior history of hysterectomy.  She was seen in er where she has normal vital signs, wbc elevated and a ct scan that does show mesenteric twisting. I was asked to see her.   PMH: htn, hypothyroidism, TIA  Past Surgical History:  Procedure Laterality Date   BREAST CYST ASPIRATION Right     Family History  Problem Relation Age of Onset   Breast cancer Neg Hx    Social History:  has no history on file for tobacco use, alcohol use, and drug use.  Allergies: latex, sulfa  Current Facility-Administered Medications  Medication Dose Route Frequency Provider Last Rate Last Admin   ceFAZolin (ANCEF) IVPB 2g/100 mL premix  2 g Intravenous Once Rolm Bookbinder, MD       potassium chloride SA (KLOR-CON M) CR tablet 20 mEq  20 mEq Oral Once Suzy Bouchard, PA-C       No current outpatient medications on file.      Results for orders placed or performed during the hospital encounter of 11/06/22 (from the past 48 hour(s))  CBC with Differential     Status: Abnormal   Collection Time: 11/06/22 10:05 PM  Result Value Ref Range   WBC 13.7 (H) 4.0 - 10.5 K/uL   RBC 5.18 (H) 3.87 - 5.11 MIL/uL   Hemoglobin 15.6 (H) 12.0 - 15.0 g/dL   HCT 47.2 (H) 36.0 - 46.0 %   MCV 91.1 80.0 - 100.0 fL   MCH 30.1 26.0 - 34.0 pg   MCHC 33.1 30.0 - 36.0 g/dL   RDW 12.9 11.5 - 15.5 %   Platelets 193 150 - 400 K/uL   nRBC 0.0 0.0 - 0.2 %   Neutrophils Relative % 83 %   Neutro Abs 11.4 (H) 1.7 - 7.7 K/uL   Lymphocytes Relative 10 %   Lymphs Abs 1.3 0.7 - 4.0 K/uL    Monocytes Relative 6 %   Monocytes Absolute 0.9 0.1 - 1.0 K/uL   Eosinophils Relative 0 %   Eosinophils Absolute 0.0 0.0 - 0.5 K/uL   Basophils Relative 0 %   Basophils Absolute 0.1 0.0 - 0.1 K/uL   Immature Granulocytes 1 %   Abs Immature Granulocytes 0.07 0.00 - 0.07 K/uL    Comment: Performed at [facility], [place] [address], [place], [place] [address]  Comprehensive metabolic panel     Status: Abnormal   Collection Time: 11/06/22 10:05 PM  Result Value Ref Range   Sodium 136 135 - 145 mmol/L   Potassium 3.3 (L) 3.5 - 5.1 mmol/L   Chloride 98 98 - 111 mmol/L   CO2 24 22 - 32 mmol/L   Glucose, Bld 167 (H) 70 - 99 mg/dL    Comment: Glucose reference range applies only to samples taken after fasting for at least 8 hours.   BUN 16 8 - 23 mg/dL   Creatinine, Ser 0.63 0.44 - 1.00 mg/dL   Calcium 9.6 8.9 - 10.3 mg/dL   Total Protein 8.2 (H) 6.5 -  8.1 g/dL   Albumin 5.0 3.5 - 5.0 g/dL   AST 55 (H) 15 - 41 U/L   ALT 25 0 - 44 U/L   Alkaline Phosphatase 66 38 - 126 U/L   Total Bilirubin 1.1 0.3 - 1.2 mg/dL   GFR, Estimated >60 >60 mL/min    Comment: (NOTE) Calculated using the CKD-EPI Creatinine Equation (2021)    Anion gap 14 5 - 15    Comment: Performed at [facility], [place] [address], [place], [place] [address]  Lipase, blood     Status: None   Collection Time: 11/06/22 10:05 PM  Result Value Ref Range   Lipase 37 11 - 51 U/L    Comment: Performed at [facility], [place] [address], [place], [place] [address]   CT ABDOMEN PELVIS W CONTRAST  Result Date: 11/07/2022 CLINICAL DATA:  Abdominal pain. EXAM: CT ABDOMEN AND PELVIS WITH CONTRAST TECHNIQUE: Multidetector CT imaging of the abdomen and pelvis was performed using the standard protocol following bolus administration of intravenous contrast. RADIATION DOSE REDUCTION: This exam was performed according to the departmental dose-optimization program which includes automated  exposure control, adjustment of the mA and/or kV according to patient size and/or use of iterative reconstruction technique. CONTRAST:  1108m OMNIPAQUE IOHEXOL 300 MG/ML  SOLN COMPARISON:  None Available. FINDINGS: Lower chest: No acute abnormality. Hepatobiliary: No focal liver abnormality is seen. No gallstones, gallbladder wall thickening, or biliary dilatation. Pancreas: Unremarkable. No pancreatic ductal dilatation or surrounding inflammatory changes. Spleen: Normal in size without focal abnormality. Adrenals/Urinary Tract: Adrenal glands are unremarkable. Kidneys are normal, without renal calculi, focal lesion, or hydronephrosis. Bladder is unremarkable. Stomach/Bowel: Stomach is within normal limits. The appendix is not identified. No evidence of bowel wall thickening, distention, or inflammatory changes. Twisting of the mesentery is seen within the midline of the lower abdomen, without evidence of associated bowel or vascular abnormality (axial CT images 50 through 58, CT series 2). Vascular/Lymphatic: Aortic atherosclerosis. No enlarged abdominal or pelvic lymph nodes. Reproductive: Status post hysterectomy. No adnexal masses. Other: No abdominal wall hernia or abnormality. No abdominopelvic ascites. Musculoskeletal: Degenerative changes are seen at the level of L5-S1. IMPRESSION: 1. Mesenteric twisting within the midline of the lower abdomen, without evidence of associated bowel or vascular abnormality. This may represent an internal hernia. 2. Aortic atherosclerosis. Aortic Atherosclerosis (ICD10-I70.0). Electronically Signed   By: TVirgina NorfolkM.D.   On: 11/07/2022 00:21    Review of Systems  Constitutional:  Negative for fever.  Gastrointestinal:  Positive for abdominal pain and nausea. Negative for vomiting.  All other systems reviewed and are negative.   Blood pressure (!) 143/66, pulse 73, temperature 98.2 F (36.8 C), resp. rate 14, SpO2 98 %. Physical Exam Vitals reviewed.   Constitutional:      Appearance: She is well-developed.  Eyes:     General: No scleral icterus. Cardiovascular:     Rate and Rhythm: Normal rate and regular rhythm.  Pulmonary:     Effort: Pulmonary effort is normal.  Abdominal:     General: There is no distension.     Palpations: Abdomen is soft.     Tenderness: There is abdominal tenderness in the epigastric area and periumbilical area.     Hernia: No hernia is present.     Comments: Low transverse incision healed  Neurological:     Mental Status: She is alert.      Assessment/Plan ? SBO, mesenteric volvulus by CT  -CT does have abnormality  and could have internal hernia. She has had a couple times and resolved previously. She does feel better now but not back to normal. She has some tenderness and remains nauseated. I think due to concern of twisting and her history and current exam she needs to go to OR for exploration. We discussed rationale for this as well as procedure. Discussed risks, will proceed urgently to OR. Discussed small possibility of recurrence as well as multiple operations.   I spent 48 minutes with patient, documenting. I reviewed labs and I reviewed CT independently.  Discussed with ER provider as well.   Rolm Bookbinder, MD 11/07/2022, 1:22 AM

## 2022-11-08 ENCOUNTER — Encounter (HOSPITAL_COMMUNITY): Payer: Self-pay | Admitting: General Surgery

## 2022-11-08 LAB — CBC
HCT: 38.5 % (ref 36.0–46.0)
Hemoglobin: 12.5 g/dL (ref 12.0–15.0)
MCH: 30.3 pg (ref 26.0–34.0)
MCHC: 32.5 g/dL (ref 30.0–36.0)
MCV: 93.4 fL (ref 80.0–100.0)
Platelets: 151 10*3/uL (ref 150–400)
RBC: 4.12 MIL/uL (ref 3.87–5.11)
RDW: 13.5 % (ref 11.5–15.5)
WBC: 8.8 10*3/uL (ref 4.0–10.5)
nRBC: 0 % (ref 0.0–0.2)

## 2022-11-08 LAB — BASIC METABOLIC PANEL
Anion gap: 5 (ref 5–15)
BUN: 9 mg/dL (ref 8–23)
CO2: 24 mmol/L (ref 22–32)
Calcium: 8.3 mg/dL — ABNORMAL LOW (ref 8.9–10.3)
Chloride: 110 mmol/L (ref 98–111)
Creatinine, Ser: 0.57 mg/dL (ref 0.44–1.00)
GFR, Estimated: >60 mL/min (ref >60)
Glucose, Bld: 109 mg/dL — ABNORMAL HIGH (ref 70–99)
Potassium: 3.5 mmol/L (ref 3.5–5.1)
Sodium: 139 mmol/L (ref 135–145)

## 2022-11-08 MED ORDER — ORAL CARE MOUTH RINSE: 15.0000 mL | OROMUCOSAL | Status: DC | PRN

## 2022-11-08 NOTE — Progress Notes (Addendum)
Mobility Specialist - Progress Note   11/08/22 0940  Mobility  Activity Ambulated with assistance in hallway  Level of Assistance Modified independent, requires aide device or extra time  Assistive Device Other (Comment) (IV Pole)  Distance Ambulated (ft) 186 ft  Range of Motion/Exercises Active  Activity Response Tolerated well  Mobility Referral Yes  $Mobility charge 1 Mobility   Pt was found in bed and agreeable to ambulate. Still experiencing some abdominal soreness. At EOS returned to sit EOB with NT in room. LPN notified of session.  Ferd Hibbs Mobility Specialist

## 2022-11-08 NOTE — Progress Notes (Signed)
1 Day Post-Op  Subjective: Feeling much better today.  No further nausea.  Tolerating some CLD.  Only taking Tylenol for pain.  Ambulating.   Objective: Vital signs in last 24 hours: Temp:  [98.2 F (36.8 C)-98.7 F (37.1 C)] 98.2 F (36.8 C) (01/23 0844) Pulse Rate:  [66-75] 73 (01/23 0844) Resp:  [14-18] 14 (01/23 0844) BP: (108-134)/(49-61) 134/61 (01/23 0844) SpO2:  [93 %-97 %] 94 % (01/23 0844) Last BM Date : 11/06/22  Intake/Output from previous day: 01/22 0701 - 01/23 0700 In: 2995.3 [P.O.:840; I.V.:2150.3; IV Piggyback:5] Out: 2900 [Urine:2700; Emesis/NG output:200] Intake/Output this shift: Total I/O In: -  Out: 300 [Urine:300]  PE: Gen: NAD Heart: regular Lungs: CTAB, on O2 via [place] Abd: soft, appropriately tender, +BS, midline wound is stable, c/d/I with staples and honeycomb dressing with some old bloody drainage from yesterday GU: foley in place with clear yellow urine   Lab Results:  Recent Labs    11/07/22 0502 11/08/22 0457  WBC 10.4 8.8  HGB 15.1* 12.5  HCT 48.1* 38.5  PLT 177 151   BMET Recent Labs    11/07/22 0502 11/08/22 0457  NA 135 139  K 3.8 3.5  CL 105 110  CO2 20* 24  GLUCOSE 160* 109*  BUN 13 9  CREATININE 0.64 0.57  CALCIUM 8.4* 8.3*   PT/INR No results for input(s): "LABPROT", "INR" in the last 72 hours. CMP     Component Value Date/Time   NA 139 11/08/2022 0457   K 3.5 11/08/2022 0457   CL 110 11/08/2022 0457   CO2 24 11/08/2022 0457   GLUCOSE 109 (H) 11/08/2022 0457   BUN 9 11/08/2022 0457   CREATININE 0.57 11/08/2022 0457   CALCIUM 8.3 (L) 11/08/2022 0457   PROT 8.2 (H) 11/06/2022 2205   ALBUMIN 5.0 11/06/2022 2205   AST 55 (H) 11/06/2022 2205   ALT 25 11/06/2022 2205   ALKPHOS 66 11/06/2022 2205   BILITOT 1.1 11/06/2022 2205   GFRNONAA >60 11/08/2022 0457   GFRAA  09/30/2009 0905    >60        The eGFR has been calculated using the MDRD equation. This calculation has not been validated in all  clinical situations. eGFR's persistently <60 mL/min signify possible Chronic Kidney Disease.   Lipase     Component Value Date/Time   LIPASE 37 11/06/2022 2205       Studies/Results: CT ABDOMEN PELVIS W CONTRAST  Result Date: 11/07/2022 CLINICAL DATA:  Abdominal pain. EXAM: CT ABDOMEN AND PELVIS WITH CONTRAST TECHNIQUE: Multidetector CT imaging of the abdomen and pelvis was performed using the standard protocol following bolus administration of intravenous contrast. RADIATION DOSE REDUCTION: This exam was performed according to the departmental dose-optimization program which includes automated exposure control, adjustment of the mA and/or kV according to patient size and/or use of iterative reconstruction technique. CONTRAST:  133m OMNIPAQUE IOHEXOL 300 MG/ML  SOLN COMPARISON:  None Available. FINDINGS: Lower chest: No acute abnormality. Hepatobiliary: No focal liver abnormality is seen. No gallstones, gallbladder wall thickening, or biliary dilatation. Pancreas: Unremarkable. No pancreatic ductal dilatation or surrounding inflammatory changes. Spleen: Normal in size without focal abnormality. Adrenals/Urinary Tract: Adrenal glands are unremarkable. Kidneys are normal, without renal calculi, focal lesion, or hydronephrosis. Bladder is unremarkable. Stomach/Bowel: Stomach is within normal limits. The appendix is not identified. No evidence of bowel wall thickening, distention, or inflammatory changes. Twisting of the mesentery is seen within the midline of the lower abdomen, without evidence of associated  bowel or vascular abnormality (axial CT images 50 through 58, CT series 2). Vascular/Lymphatic: Aortic atherosclerosis. No enlarged abdominal or pelvic lymph nodes. Reproductive: Status post hysterectomy. No adnexal masses. Other: No abdominal wall hernia or abnormality. No abdominopelvic ascites. Musculoskeletal: Degenerative changes are seen at the level of L5-S1. IMPRESSION: 1. Mesenteric  twisting within the midline of the lower abdomen, without evidence of associated bowel or vascular abnormality. This may represent an internal hernia. 2. Aortic atherosclerosis. Aortic Atherosclerosis (ICD10-I70.0). Electronically Signed   By: Virgina Norfolk M.D.   On: 11/07/2022 00:21    Anti-infectives: Anti-infectives (From admission, onward)    Start     Dose/Rate Route Frequency Ordered Stop   11/07/22 0212  ceFAZolin (ANCEF) 2-4 GM/100ML-% IVPB       Note to Pharmacy: Nicholes Rough S: cabinet override      11/07/22 0212 11/07/22 0300   11/07/22 0130  ceFAZolin (ANCEF) IVPB 2g/100 mL premix        2 g 200 mL/hr over 30 Minutes Intravenous  Once 11/07/22 0120 11/07/22 0258        Assessment/Plan POD 1, s/p ex lap with LOA for internal hernia, Dr. Donne Hazel 11/07/22 -cont CLD today but if has some flatus today, will adv to FLD -DC foley -no postop abx  -mobilize/pulm toilet   FEN - CLD/IVFs VTE - lovenox ID - just pre-op dose  H/O TIA - plavix on hold   LOS: 1 day    Henreitta Cea , [facility] 11/08/2022, 10:13 AM Please see Amion for pager number during day hours 7:00am-4:30pm or 7:00am -11:30am on weekends

## 2022-11-08 NOTE — Progress Notes (Addendum)
Spoke with Pt sister, Charlett Nose, via phone about pt condition. Pt room phone not working correctly and Charlett Nose could not get through to talk to pt. Let Charlett Nose know pt is doing well and in stable condition and I would have pt call her back. Order put in for new room phone and made pt aware Charlett Nose was trying to reach her.

## 2022-11-09 MED ORDER — ENOXAPARIN SODIUM 40 MG/0.4ML IJ SOSY: 40.0000 mg | PREFILLED_SYRINGE | INTRAMUSCULAR | Status: DC

## 2022-11-09 NOTE — Progress Notes (Signed)
Mobility Specialist - Progress Note   11/09/22 0936  Mobility  Activity Ambulated with assistance in hallway  Level of Assistance Modified independent, requires aide device or extra time  Assistive Device Other (Comment) (IV Pole)  Distance Ambulated (ft) 210 ft  Range of Motion/Exercises Active  Activity Response Tolerated well  Mobility Referral Yes  $Mobility charge 1 Mobility   Pt was found in bed and agreeable to ambulate. Had no complaints during session and at EOS returned to bed with all necessities in reach.  Ferd Hibbs Mobility Specialist

## 2022-11-09 NOTE — Progress Notes (Signed)
Patient went to BR around 3am and there was no blood seen at the surgical site.  This morning she woke up to go to BR and the honeycomb dressing was saturated with blood running down stomach. Reinforced the dressing with 2 ABD pads and tape. Will continue to monitor.

## 2022-11-09 NOTE — Progress Notes (Signed)
2 Days Post-Op  Subjective: Continues to feel better.  Passing flatus.  Tolerating FLD with no nausea or vomiting.  No BM yet.  Bleeding from her abdominal wound today.  Objective: Vital signs in last 24 hours: Temp:  [97.8 F (36.6 C)-98.3 F (36.8 C)] 98.2 F (36.8 C) (01/24 0602) Pulse Rate:  [64-66] 64 (01/24 0602) Resp:  [16-18] 16 (01/24 0602) BP: (134-139)/(58-60) 135/58 (01/24 0602) SpO2:  [94 %-95 %] 95 % (01/24 0602) Last BM Date : 11/06/22  Intake/Output from previous day: 01/23 0701 - 01/24 0700 In: 1357.9 [P.O.:510; I.V.:847.9] Out: 2300 [Urine:2300] Intake/Output this shift: Total I/O In: 480 [P.O.:480] Out: 500 [Urine:500]  PE: Gen: NAD Abd: soft, appropriately tender, +BS, slight increase in bloating today, midline wound with bleeding at skin edge on lower portion of wound.  Pressure held and bleeding ceased.  Dressing replaced.   Lab Results:  Recent Labs    11/07/22 0502 11/08/22 0457  WBC 10.4 8.8  HGB 15.1* 12.5  HCT 48.1* 38.5  PLT 177 151   BMET Recent Labs    11/07/22 0502 11/08/22 0457  NA 135 139  K 3.8 3.5  CL 105 110  CO2 20* 24  GLUCOSE 160* 109*  BUN 13 9  CREATININE 0.64 0.57  CALCIUM 8.4* 8.3*   PT/INR No results for input(s): "LABPROT", "INR" in the last 72 hours. CMP     Component Value Date/Time   NA 139 11/08/2022 0457   K 3.5 11/08/2022 0457   CL 110 11/08/2022 0457   CO2 24 11/08/2022 0457   GLUCOSE 109 (H) 11/08/2022 0457   BUN 9 11/08/2022 0457   CREATININE 0.57 11/08/2022 0457   CALCIUM 8.3 (L) 11/08/2022 0457   PROT 8.2 (H) 11/06/2022 2205   ALBUMIN 5.0 11/06/2022 2205   AST 55 (H) 11/06/2022 2205   ALT 25 11/06/2022 2205   ALKPHOS 66 11/06/2022 2205   BILITOT 1.1 11/06/2022 2205   GFRNONAA >60 11/08/2022 0457   GFRAA  09/30/2009 0905    >60        The eGFR has been calculated using the MDRD equation. This calculation has not been validated in all clinical situations. eGFR's  persistently <60 mL/min signify possible Chronic Kidney Disease.   Lipase     Component Value Date/Time   LIPASE 37 11/06/2022 2205       Studies/Results: No results found.  Anti-infectives: Anti-infectives (From admission, onward)    Start     Dose/Rate Route Frequency Ordered Stop   11/07/22 0212  ceFAZolin (ANCEF) 2-4 GM/100ML-% IVPB       Note to Pharmacy: Nicholes Rough S: cabinet override      11/07/22 0212 11/07/22 0300   11/07/22 0130  ceFAZolin (ANCEF) IVPB 2g/100 mL premix        2 g 200 mL/hr over 30 Minutes Intravenous  Once 11/07/22 0120 11/07/22 0258        Assessment/Plan POD 3, s/p ex lap with LOA for internal hernia, Dr. Donne Hazel 11/07/22 -regular diet and monitor -voiding well -no postop abx  -mobilize/pulm toilet  -hold lovenox today for wound bleeding, resume tomorrow -monitor for another day and if continues to do well, can likely DC tomorrow -SLIV   FEN - regular, SLIV VTE - lovenox on hold today, resume tomorrow ID - just pre-op dose  H/O TIA - plavix on hold   LOS: 2 days    Henreitta Cea , [facility] 11/09/2022, 10:14 AM Please  see Amion for pager number during day hours 7:00am-4:30pm or 7:00am -11:30am on weekends

## 2022-11-10 MED ORDER — METHOCARBAMOL 500 MG PO TABS: 500.0000 mg | ORAL_TABLET | Freq: Three times a day (TID) | ORAL | 0 refills | Status: DC | PRN

## 2022-11-10 MED ORDER — TRAMADOL HCL 50 MG PO TABS: 50.0000 mg | ORAL_TABLET | Freq: Four times a day (QID) | ORAL | 0 refills | Status: DC | PRN

## 2022-11-10 MED ORDER — ACETAMINOPHEN 500 MG PO TABS: 1000.0000 mg | ORAL_TABLET | Freq: Four times a day (QID) | ORAL | 0 refills | Status: AC | PRN

## 2022-11-10 NOTE — Progress Notes (Signed)
Mobility Specialist - Progress Note   11/10/22 0912  Mobility  Activity Ambulated independently in hallway  Level of Assistance Independent  Assistive Device None  Distance Ambulated (ft) 500 ft  Activity Response Tolerated well  Mobility Referral Yes  $Mobility charge 1 Mobility   Pt received in bathroom standing and agreeable to mobility. No complaints during session. Pt to bed after session with all needs met.   [facility]

## 2022-11-10 NOTE — Discharge Instructions (Addendum)
May resume plavix on 1/26.  [facility], [place] [phone]  OPEN ABDOMINAL SURGERY: POST OP INSTRUCTIONS  Always review your discharge instruction sheet given to you by the facility where your surgery was performed.  IF YOU HAVE DISABILITY OR FAMILY LEAVE FORMS, YOU MUST BRING THEM TO THE OFFICE FOR PROCESSING.  PLEASE DO NOT GIVE THEM TO YOUR DOCTOR.  A prescription for pain medication may be given to you upon discharge.  Take your pain medication as prescribed, if needed.  If narcotic pain medicine is not needed, then you may take acetaminophen (Tylenol) or ibuprofen (Advil) as needed. Take your usually prescribed medications unless otherwise directed. If you need a refill on your pain medication, please contact your pharmacy. They will contact our office to request authorization.  Prescriptions will not be filled after 5pm or on week-ends. You should follow a light diet the first few days after arrival home, such as soup and crackers, pudding, etc.unless your doctor has advised otherwise. A high-fiber, low fat diet can be resumed as tolerated.   Be sure to include lots of fluids daily. Most patients will experience some swelling and bruising on the chest and neck area.  Ice packs will help.  Swelling and bruising can take several days to resolve Most patients will experience some swelling and bruising in the area of the incision. Ice pack will help. Swelling and bruising can take several days to resolve..  It is common to experience some constipation if taking pain medication after surgery.  Increasing fluid intake and taking a stool softener will usually help or prevent this problem from occurring.  A mild laxative (Milk of Magnesia or Miralax) should be taken according to package directions if there are no bowel movements after 48 hours.  You may have steri-strips (small skin tapes) in place directly over the incision.  These strips should be left on the skin for 7-10 days.   If your surgeon used skin glue on the incision, you may shower in 24 hours.  The glue will flake off over the next 2-3 weeks.  Any sutures or staples will be removed at the office during your follow-up visit. You may find that a light gauze bandage over your incision may keep your staples from being rubbed or pulled. You may shower and replace the bandage daily. ACTIVITIES:  You may resume regular (light) daily activities beginning the next day--such as daily self-care, walking, climbing stairs--gradually increasing activities as tolerated.  You may have sexual intercourse when it is comfortable.  Refrain from any heavy lifting or straining until approved by your doctor. You may drive when you no longer are taking prescription pain medication, you can comfortably wear a seatbelt, and you can safely maneuver your car and apply brakes Return to Work: ___________________________________ Debra Herman should see your doctor in the office for a follow-up appointment approximately two weeks after your surgery.  Make sure that you call for this appointment within a day or two after you arrive home to insure a convenient appointment time. OTHER INSTRUCTIONS:  _____________________________________________________________ _____________________________________________________________  WHEN TO CALL YOUR DOCTOR: Fever over 101.0 Inability to urinate Nausea and/or vomiting Extreme swelling or bruising Continued bleeding from incision. Increased pain, redness, or drainage from the incision. Difficulty swallowing or breathing Muscle cramping or spasms. Numbness or tingling in hands or feet or around lips.  The clinic staff is available to answer your questions during regular business hours.  Please don't hesitate to call and  ask to speak to one of the nurses if you have concerns.  For further questions, please visit [email or web address]

## 2022-11-10 NOTE — Discharge Summary (Signed)
Patient ID: Debra Herman [id number] [birth year] 77 y.o.  Admit date: 11/06/2022 Discharge date: 11/10/2022  Admitting Diagnosis: SBO H/O TIA  Discharge Diagnosis Patient Active Problem List   Diagnosis Date Noted   S/P exploratory laparotomy 11/07/2022  SBO H/O TIA  Consultants none  Reason for Admission:  37 yof who has a couple episodes of brief abdominal pain several months that resolved quickly.  She is on plavix for prior TIA and took this last night.  Today around 6 pm she had acute onset of bandlike upper abdominal pain that has not gone away. She feels somewhat better now. She remains nauseated.  She has no emesis, was having bowel function until this.  She has prior history of hysterectomy.  She was seen in er where she has normal vital signs, wbc elevated and a ct scan that does show mesenteric twisting. I was asked to see her.   Procedures Ex lap witih LOA, Dr. Donne Hazel 11/07/22  Hospital Course:  The patient was admitted and underwent the above procedure.  Her bowel function returned quickly and her diet was able to be advanced as tolerated.  She mobilized well and was voiding well.  She had good pain control with just Tylenol.  She did have some skin edge bleeding from her wound.  Pressure was held and this ceased.  She was other stable on POD 3, for Dc home with appropriate follow up for staple removal and MD follow up.  Physical Exam: Gen: NAD Abd: soft, appropriately tender, ND, +BS, midline incision c/d/I with staples and some ecchymosis secondary to the oozing from yesterday.  No further bleeding.  Allergies as of 11/10/2022       Reactions   Sulfa Antibiotics Itching   Latex Hives, Rash        Medication List     TAKE these medications    acetaminophen 500 MG tablet Commonly known as: TYLENOL Take 2 tablets (1,000 mg total) by mouth every 6 (six) hours as needed.   atorvastatin 20 MG tablet Commonly known as: LIPITOR Take 20 mg by mouth  every evening.   bisoprolol-hydrochlorothiazide 5-6.25 MG tablet Commonly known as: ZIAC Take 1 tablet by mouth daily.   Calcium 250 MG Caps Take 1 capsule by mouth daily.   cholecalciferol 25 MCG (1000 UNIT) tablet Commonly known as: VITAMIN D3 Take 1,000 Units by mouth daily.   clopidogrel 75 MG tablet Commonly known as: PLAVIX Take 75 mg by mouth every evening.   ESTRACE VAGINAL 0.1 MG/GM vaginal cream Generic drug: estradiol Place 1 Applicatorful vaginally daily.   ICAPS AREDS 2 PO Take 1 tablet by mouth in the morning and at bedtime.   levothyroxine 75 MCG tablet Commonly known as: SYNTHROID Take 75 mcg by mouth daily before breakfast.   methocarbamol 500 MG tablet Commonly known as: ROBAXIN Take 1 tablet (500 mg total) by mouth every 8 (eight) hours as needed for muscle spasms.   traMADol 50 MG tablet Commonly known as: Ultram Take 1 tablet (50 mg total) by mouth every 6 (six) hours as needed.          Follow-up Information     [facility] [place] Follow up on 11/18/2022.   Specialty: General Surgery Why: this is a nurse only visit for staple removal., Arrive 30 minutes prior to your appointment time, Please bring your insurance card and photo ID, Office will call you with a follow up appointment, If you don't hear from the office, please call  Contact information: [address] [place] [address] [phone]         Rolm Bookbinder, MD Follow up in 3 week(s).   Specialty: General Surgery Why: Office will call you with a follow up appointment, If you don't hear from the office, please call Contact information: [address] [place] [address] [phone]                 Signed: Saverio Danker, [facility] Surgery 11/10/2022, 8:50 AM Please see Amion for pager number during day hours 7:00am-4:30pm, 7-11:30am on Weekends

## 2022-11-10 NOTE — Plan of Care (Signed)

## 2022-11-10 NOTE — Progress Notes (Signed)
Assessment unchanged. Pt and husband verbalized understanding of dc instructions through teach back including medications, follow up care and when to call the MD. Discharged via wc to front entrance accompanied by NT and Husband.

## 2022-11-17 DIAGNOSIS — Z6827 Body mass index (BMI) 27.0-27.9, adult: Secondary | ICD-10-CM | POA: Diagnosis not present

## 2022-11-17 DIAGNOSIS — K56609 Unspecified intestinal obstruction, unspecified as to partial versus complete obstruction: Secondary | ICD-10-CM | POA: Diagnosis not present

## 2022-11-17 DIAGNOSIS — R7301 Impaired fasting glucose: Secondary | ICD-10-CM | POA: Diagnosis not present

## 2022-11-17 DIAGNOSIS — R202 Paresthesia of skin: Secondary | ICD-10-CM | POA: Diagnosis not present

## 2022-11-17 DIAGNOSIS — L249 Irritant contact dermatitis, unspecified cause: Secondary | ICD-10-CM | POA: Diagnosis not present

## 2022-11-17 DIAGNOSIS — R7989 Other specified abnormal findings of blood chemistry: Secondary | ICD-10-CM | POA: Diagnosis not present

## 2022-11-23 ENCOUNTER — Other Ambulatory Visit: Payer: Self-pay | Admitting: Gastroenterology

## 2022-11-23 DIAGNOSIS — R9389 Abnormal findings on diagnostic imaging of other specified body structures: Secondary | ICD-10-CM | POA: Diagnosis not present

## 2022-11-23 DIAGNOSIS — K838 Other specified diseases of biliary tract: Secondary | ICD-10-CM

## 2022-12-14 DIAGNOSIS — L814 Other melanin hyperpigmentation: Secondary | ICD-10-CM | POA: Diagnosis not present

## 2022-12-14 DIAGNOSIS — L821 Other seborrheic keratosis: Secondary | ICD-10-CM | POA: Diagnosis not present

## 2022-12-14 DIAGNOSIS — L905 Scar conditions and fibrosis of skin: Secondary | ICD-10-CM | POA: Diagnosis not present

## 2022-12-14 DIAGNOSIS — L578 Other skin changes due to chronic exposure to nonionizing radiation: Secondary | ICD-10-CM | POA: Diagnosis not present

## 2022-12-14 DIAGNOSIS — D225 Melanocytic nevi of trunk: Secondary | ICD-10-CM | POA: Diagnosis not present

## 2022-12-18 ENCOUNTER — Ambulatory Visit
Admission: RE | Admit: 2022-12-18 | Discharge: 2022-12-18 | Disposition: A | Discharge status: Home / Self Care | Payer: Medicare Other | Source: Ambulatory Visit | Attending: Gastroenterology | Admitting: Gastroenterology

## 2022-12-18 DIAGNOSIS — K838 Other specified diseases of biliary tract: Secondary | ICD-10-CM

## 2022-12-18 DIAGNOSIS — R9389 Abnormal findings on diagnostic imaging of other specified body structures: Secondary | ICD-10-CM | POA: Diagnosis not present

## 2022-12-18 MED ORDER — GADOPICLENOL 0.5 MMOL/ML IV SOLN
6.0000 mL | Freq: Once | INTRAVENOUS | Status: AC | PRN
  Administered 2022-12-18: 6 mL via INTRAVENOUS

## 2022-12-23 ENCOUNTER — Other Ambulatory Visit (HOSPITAL_COMMUNITY): Payer: Self-pay | Admitting: Gastroenterology

## 2022-12-23 DIAGNOSIS — K869 Disease of pancreas, unspecified: Secondary | ICD-10-CM

## 2022-12-30 DIAGNOSIS — K869 Disease of pancreas, unspecified: Secondary | ICD-10-CM | POA: Diagnosis not present

## 2023-01-12 ENCOUNTER — Encounter (HOSPITAL_COMMUNITY)
Admission: RE | Admit: 2023-01-12 | Discharge: 2023-01-12 | Disposition: A | Discharge status: Home / Self Care | Payer: Medicare Other | Source: Ambulatory Visit | Attending: Gastroenterology | Admitting: Gastroenterology

## 2023-01-12 DIAGNOSIS — I2510 Atherosclerotic heart disease of native coronary artery without angina pectoris: Secondary | ICD-10-CM | POA: Diagnosis not present

## 2023-01-12 DIAGNOSIS — I700 Atherosclerosis of aorta: Secondary | ICD-10-CM | POA: Insufficient documentation

## 2023-01-12 DIAGNOSIS — K869 Disease of pancreas, unspecified: Secondary | ICD-10-CM | POA: Insufficient documentation

## 2023-01-12 DIAGNOSIS — R918 Other nonspecific abnormal finding of lung field: Secondary | ICD-10-CM | POA: Diagnosis not present

## 2023-01-12 DIAGNOSIS — R9389 Abnormal findings on diagnostic imaging of other specified body structures: Secondary | ICD-10-CM | POA: Insufficient documentation

## 2023-01-12 MED ORDER — COPPER CU 64 DOTATATE 1 MCI/ML IV SOLN
4.0000 | Freq: Once | INTRAVENOUS | Status: AC
  Administered 2023-01-12: 4 via INTRAVENOUS

## 2023-01-31 ENCOUNTER — Other Ambulatory Visit: Payer: Self-pay | Admitting: Gastroenterology

## 2023-02-22 ENCOUNTER — Encounter (HOSPITAL_COMMUNITY): Payer: Self-pay | Admitting: Gastroenterology

## 2023-02-28 NOTE — Anesthesia Preprocedure Evaluation (Signed)
Anesthesia Evaluation  Patient identified by MRN, date of birth, ID band Patient awake    Reviewed: Allergy & Precautions, H&P , NPO status , Patient's Chart, lab work & pertinent test results  Airway Mallampati: II  TM Distance: >3 FB Neck ROM: Limited    Dental no notable dental hx. (+) Dental Advisory Given, Teeth Intact   Pulmonary neg pulmonary ROS   Pulmonary exam normal breath sounds clear to auscultation       Cardiovascular hypertension, Pt. on home beta blockers negative cardio ROS Normal cardiovascular exam Rhythm:Regular Rate:Normal  Echo 2014 - Left ventricle: The cavity size was normal. There was mild    concentric hypertrophy. Systolic function was normal. The    estimated ejection fraction was in the range of 55% to    60%. Wall motion was normal; there were no regional wall    motion abnormalities.  - Aortic valve: Mild regurgitation.  - Mitral valve: Mild regurgitation.  - Left atrium: The atrium was mildly dilated.  - Pulmonary arteries: PA peak pressure: 33mm Hg (S).     Neuro/Psych TIAnegative neurological ROS  negative psych ROS   GI/Hepatic negative GI ROS, Neg liver ROS,,,  Endo/Other  negative endocrine ROSHypothyroidism    Renal/GU negative Renal ROS  negative genitourinary   Musculoskeletal negative musculoskeletal ROS (+)    Abdominal   Peds negative pediatric ROS (+)  Hematology negative hematology ROS (+)   Anesthesia Other Findings All: sulfa Latex  Reproductive/Obstetrics negative OB ROS                             Anesthesia Physical Anesthesia Plan  ASA: 3  Anesthesia Plan: MAC   Post-op Pain Management: Ofirmev IV (intra-op)*   Induction:   PONV Risk Score and Plan: 2 and Treatment may vary due to age or medical condition and Propofol infusion  Airway Management Planned: Nasal Cannula and Natural Airway  Additional Equipment:  None  Intra-op Plan:   Post-operative Plan:   Informed Consent: I have reviewed the patients History and Physical, chart, labs and discussed the procedure including the risks, benefits and alternatives for the proposed anesthesia with the patient or authorized representative who has indicated his/her understanding and acceptance.       Plan Discussed with: CRNA  Anesthesia Plan Comments:        Anesthesia Quick Evaluation

## 2023-03-01 ENCOUNTER — Ambulatory Visit (HOSPITAL_COMMUNITY): Payer: Medicare Other | Admitting: Anesthesiology

## 2023-03-01 ENCOUNTER — Other Ambulatory Visit: Payer: Self-pay

## 2023-03-01 ENCOUNTER — Encounter (HOSPITAL_COMMUNITY)
Admission: RE | Disposition: A | Discharge status: Home / Self Care | Payer: Self-pay | Source: Home / Self Care | Attending: Gastroenterology

## 2023-03-01 ENCOUNTER — Ambulatory Visit (HOSPITAL_BASED_OUTPATIENT_CLINIC_OR_DEPARTMENT_OTHER): Payer: Medicare Other | Admitting: Anesthesiology

## 2023-03-01 ENCOUNTER — Ambulatory Visit (HOSPITAL_COMMUNITY)
Admission: RE | Admit: 2023-03-01 | Discharge: 2023-03-01 | Disposition: A | Discharge status: Home / Self Care | Payer: Medicare Other | Attending: Gastroenterology | Admitting: Gastroenterology

## 2023-03-01 ENCOUNTER — Encounter (HOSPITAL_COMMUNITY): Payer: Self-pay | Admitting: Gastroenterology

## 2023-03-01 DIAGNOSIS — K8689 Other specified diseases of pancreas: Secondary | ICD-10-CM

## 2023-03-01 DIAGNOSIS — I10 Essential (primary) hypertension: Secondary | ICD-10-CM

## 2023-03-01 DIAGNOSIS — R933 Abnormal findings on diagnostic imaging of other parts of digestive tract: Secondary | ICD-10-CM | POA: Insufficient documentation

## 2023-03-01 DIAGNOSIS — E039 Hypothyroidism, unspecified: Secondary | ICD-10-CM | POA: Diagnosis not present

## 2023-03-01 DIAGNOSIS — I899 Noninfective disorder of lymphatic vessels and lymph nodes, unspecified: Secondary | ICD-10-CM

## 2023-03-01 HISTORY — PX: UPPER ESOPHAGEAL ENDOSCOPIC ULTRASOUND (EUS): SHX6562

## 2023-03-01 HISTORY — PX: FINE NEEDLE ASPIRATION: SHX5430

## 2023-03-01 HISTORY — PX: ESOPHAGOGASTRODUODENOSCOPY (EGD) WITH PROPOFOL: SHX5813

## 2023-03-01 SURGERY — UPPER ESOPHAGEAL ENDOSCOPIC ULTRASOUND (EUS)
Anesthesia: Monitor Anesthesia Care

## 2023-03-01 MED ORDER — PROPOFOL 500 MG/50ML IV EMUL
INTRAVENOUS | Status: DC | PRN
  Administered 2023-03-01: 150 ug/kg/min via INTRAVENOUS

## 2023-03-01 MED ORDER — LIDOCAINE HCL (CARDIAC) PF 100 MG/5ML IV SOSY
PREFILLED_SYRINGE | INTRAVENOUS | Status: DC | PRN
  Administered 2023-03-01 (×2): 50 mg via INTRAVENOUS

## 2023-03-01 MED ORDER — CLOPIDOGREL BISULFATE 75 MG PO TABS: 75.0000 mg | ORAL_TABLET | Freq: Every evening | ORAL | Status: AC

## 2023-03-01 MED ORDER — PROPOFOL 10 MG/ML IV BOLUS
INTRAVENOUS | Status: DC | PRN
  Administered 2023-03-01 (×2): 20 mg via INTRAVENOUS

## 2023-03-01 MED ORDER — LACTATED RINGERS IV SOLN: INTRAVENOUS | Status: DC | PRN

## 2023-03-01 SURGICAL SUPPLY — 15 items

## 2023-03-01 NOTE — Op Note (Signed)
Debra Herman Patient Name: Debra Herman Procedure Date: 03/01/2023 MRN: [id number] Attending MD: Willis Modena , MD, [id number] Date of Birth: August 09, 1946 CSN: [id number] Age: 77 Admit Type: Outpatient Procedure:                Upper EUS Indications:              Suspected mass in pancreas on MRCP Providers:                Willis Modena, MD, Stephens Shire RN, RN, Priscella Mann, Technician Referring MD:             Dr. Vida Rigger. Medicines:                Monitored Anesthesia Care Complications:            No immediate complications. Estimated Blood Loss:     Estimated blood loss: none. Procedure:                Pre-Anesthesia Assessment:                           - Prior to the procedure, a History and Physical                            was performed, and patient medications and                            allergies were reviewed. The patient's tolerance of                            previous anesthesia was also reviewed. The risks                            and benefits of the procedure and the sedation                            options and risks were discussed with the patient.                            All questions were answered, and informed consent                            was obtained. Prior Anticoagulants: The patient has                            taken Plavix (clopidogrel), last dose was 5 days                            prior to procedure. ASA Grade Assessment: III - A                            patient with severe systemic disease. After  reviewing the risks and benefits, the patient was                            deemed in satisfactory condition to undergo the                            procedure.                           After obtaining informed consent, the endoscope was                            passed under direct vision. Throughout the                            procedure, the patient's  blood pressure, pulse, and                            oxygen saturations were monitored continuously. The                            [id number] ([id number]) Olympus linear ultrasound scope                            was introduced through the mouth, and advanced to                            the second part of duodenum. The upper EUS was                            accomplished without difficulty. The patient                            tolerated the procedure well. Scope In: Scope Out: Findings:      ENDOSONOGRAPHIC FINDING: :      There was no sign of significant endosonographic abnormality in the left       lobe of the liver. Homogeneous parenchyma was identified.      One abnormal lymph node was visualized in the porta hepatis region. It       measured 10 mm by 20 mm in maximal cross-sectional diameter. The node       was oval, hypoechoic and had well defined margins. Fine needle       aspiration for cytology was performed. Color Doppler imaging was       utilized prior to needle puncture to confirm a lack of significant       vascular structures within the needle path. Four passes were made with       the 25 gauge needle using a transduodenal approach. A stylet was used. A       cytotechnologist was present to evaluate the adequacy of the specimen.       The cellularity of the specimen was adequate.      There was no sign of significant endosonographic abnormality in the       common bile duct. The maximum diameter of the duct was 8 mm.  There was no sign of significant endosonographic abnormality in the       ampulla.      A round subtle mass was identified in the pancreatic head. The mass was       hypoechoic. The mass measured 7 mm by 6 mm in maximal cross-sectional       diameter. Appears to be in pancreas immediately upstream from ampulla,       but does not clearly involve the ampulla. The endosonographic borders       were well-defined. An intact interface was seen between the  mass and the       superior mesenteric artery, celiac trunk and portal vein suggesting a       lack of invasion. The remainder of the pancreas was examined. The       endosonographic appearance of parenchyma and the upstream pancreatic       duct indicated no duct dilation. Fine needle aspiration for cytology was       performed. Color Doppler imaging was utilized prior to needle puncture       to confirm a lack of significant vascular structures within the needle       path. Three passes were made with the 25 gauge needle using a       transduodenal approach. A stylet was used. A cytotechnologist was       present to evaluate the adequacy of the specimen. Final cytology results       are pending. Remainder of pancreatic parenchyma rather atrophic but no       other obvious mass lesions or cysts were identified in the pancreas. Impression:               - There was no evidence of significant pathology in                            the left lobe of the liver.                           - One abnormal lymph node was visualized in the                            porta hepatis region. Fine needle aspiration                            performed.                           - There was no sign of significant pathology in the                            common bile duct. .                           - Suspected very small mass in pancreas vs ampulla                            was identified in the pancreatic head. Fine needle  aspiration performed. Moderate Sedation:      None Recommendation:           - Discharge patient to home (via wheelchair).                           - Resume previous diet today.                           - Continue present medications.                           - Resume Plavix (clopidogrel) at prior dose in 2                            days.                           - Await cytology results.                           - Return to GI clinic PRN.                            - Return to referring physician as previously                            scheduled. Procedure Code(s):        --- Professional ---                           [phone], Esophagogastroduodenoscopy, flexible,                            transoral; with transendoscopic ultrasound-guided                            intramural or transmural fine needle                            aspiration/biopsy(s), (includes endoscopic                            ultrasound examination limited to the esophagus,                            stomach or duodenum, and adjacent structures) Diagnosis Code(s):        --- Professional ---                           I89.9, Noninfective disorder of lymphatic vessels                            and lymph nodes, unspecified                           K86.89, Other specified diseases of pancreas  R93.3, Abnormal findings on diagnostic imaging of                            other parts of digestive tract CPT copyright 2022 [employer] Association. All rights reserved. The codes documented in this report are preliminary and upon coder review may  be revised to meet current compliance requirements. Willis Modena, MD 03/01/2023 11:54:01 AM This report has been signed electronically. Number of Addenda: 0

## 2023-03-01 NOTE — Transfer of Care (Signed)
Immediate Anesthesia Transfer of Care Note  Patient: Zanie Mccommon Velsor  Procedure(s) Performed: UPPER ESOPHAGEAL ENDOSCOPIC ULTRASOUND (EUS) (Bilateral) ESOPHAGOGASTRODUODENOSCOPY (EGD) WITH PROPOFOL (Bilateral) FINE NEEDLE ASPIRATION (FNA) LINEAR  Patient Location: Endoscopy Unit  Anesthesia Type:MAC  Level of Consciousness: awake  Airway & Oxygen Therapy: Patient Spontanous Breathing and Patient connected to nasal cannula oxygen  Post-op Assessment: Report given to RN and Post -op Vital signs reviewed and stable  Post vital signs: Reviewed and stable  Last Vitals:  Vitals Value Taken Time  BP    Temp    Pulse 61 03/01/23 1137  Resp 19 03/01/23 1137  SpO2 100 % 03/01/23 1137  Vitals shown include unvalidated device data.  Last Pain:  Vitals:   03/01/23 0940  TempSrc: Temporal  PainSc: 0-No pain         Complications: No notable events documented.

## 2023-03-01 NOTE — Anesthesia Procedure Notes (Signed)
Procedure Name: MAC Date/Time: 03/01/2023 10:51 AM  Performed by: Caren Macadam, CRNAPre-anesthesia Checklist: Patient identified, Emergency Drugs available, Suction available, Patient being monitored and Timeout performed Patient Re-evaluated:Patient Re-evaluated prior to induction Oxygen Delivery Method: Simple face mask Preoxygenation: Pre-oxygenation with 100% oxygen Induction Type: IV induction

## 2023-03-01 NOTE — H&P (Signed)
[facility] H/P Note  Chief Complaint: pancreatic lesion  HPI: Debra Herman is an 77 y.o. female.  Pancreatic lesion (?) 5 mm head of pancreas with CBD 12 mm.  LFTs normal.  No weight loss.  Prior abdominal pain from incarcerated bowel after hysterectomy 25 years ago.  History reviewed. No pertinent past medical history.  Past Surgical History:  Procedure Laterality Date   BREAST CYST ASPIRATION Right    LAPAROTOMY N/A 11/07/2022   Procedure: EXPLORATORY LAPAROTOMY LYSIS OF ADHESIONS;  Surgeon: Emelia Loron, MD;  Location: [facility]S;  Service: General;  Laterality: N/A;    Medications Prior to Admission  Medication Sig Dispense Refill   acetaminophen (TYLENOL) 500 MG tablet Take 2 tablets (1,000 mg total) by mouth every 6 (six) hours as needed. 30 tablet 0   atorvastatin (LIPITOR) 20 MG tablet Take 20 mg by mouth every evening.     bisoprolol-hydrochlorothiazide (ZIAC) 5-6.25 MG tablet Take 1 tablet by mouth daily.     cholecalciferol (VITAMIN D3) 25 MCG (1000 UNIT) tablet Take 1,000 Units by mouth daily.     ESTRACE VAGINAL 0.1 MG/GM vaginal cream Place 1 Applicatorful vaginally 2 (two) times a week.     levothyroxine (SYNTHROID) 75 MCG tablet Take 75-112.5 mcg by mouth See admin instructions. Takes daily except on Fridays takes 112.71mcg.     Multiple Vitamins-Minerals (ICAPS AREDS 2 PO) Take 1 tablet by mouth in the morning and at bedtime.     clopidogrel (PLAVIX) 75 MG tablet Take 75 mg by mouth every evening. (Patient not taking: Reported on 02/27/2023)      Allergies:  Allergies  Allergen Reactions   Sulfa Antibiotics Itching   Latex Hives and Rash    Family History  Problem Relation Age of Onset   Breast cancer Neg Hx     Social History:  has no history on file for tobacco use, alcohol use, and drug use.   ROS: As Per HPI, all others negative   Blood pressure (!) 141/53, pulse 60, temperature (!) 97 F (36.1 C), temperature source Temporal,  resp. rate 13, height 5\' 1"  (1.549 m), weight 64 kg, SpO2 100 %. General appearance: NAD NECK:  Scar prior thyroid surgery HEENT:  [place]/AT, aniecteric NEURO:  No encephalopathy RESP:  No labored breathing CV:  Regular ABD:  Soft, non-tender  No results found for this or any previous visit (from the past 48 hour(s)). No results found.  Assessment/Plan   Pancreatic cysts, diminutive. Enhancing small 5 mm head of pancreas lesion (?), DOTATATE scan negative. Upper endoscopic ultrasound with possible fine needle aspiration. Risks (bleeding, infection, bowel perforation that could require surgery, sedation-related changes in cardiopulmonary systems), benefits (identification and possible treatment of source of symptoms, exclusion of certain causes of symptoms), and alternatives (watchful waiting, radiographic imaging studies, empiric medical treatment) of upper endoscopy with ultrasound and possible fine needle aspiration (EUS +/- FNA) were explained to patient/family in detail and patient wishes to proceed.   Debra Herman 03/01/2023, 10:40 AM

## 2023-03-01 NOTE — Anesthesia Postprocedure Evaluation (Signed)
Anesthesia Post Note  Patient: Debra Herman  Procedure(s) Performed: UPPER ESOPHAGEAL ENDOSCOPIC ULTRASOUND (EUS) (Bilateral) ESOPHAGOGASTRODUODENOSCOPY (EGD) WITH PROPOFOL (Bilateral) FINE NEEDLE ASPIRATION (FNA) LINEAR     Patient location during evaluation: Endoscopy Anesthesia Type: MAC Level of consciousness: awake and alert Pain management: pain level controlled Vital Signs Assessment: post-procedure vital signs reviewed and stable Respiratory status: spontaneous breathing, nonlabored ventilation, respiratory function stable and patient connected to nasal cannula oxygen Cardiovascular status: blood pressure returned to baseline and stable Postop Assessment: no apparent nausea or vomiting Anesthetic complications: no  No notable events documented.  Last Vitals:  Vitals:   03/01/23 1150 03/01/23 1200  BP: (!) 151/64 (!) 157/64  Pulse: (!) 59 (!) 52  Resp: 18 17  Temp:    SpO2: 96% 96%    Last Pain:  Vitals:   03/01/23 1200  TempSrc:   PainSc: 0-No pain                 Trevor Iha

## 2023-03-01 NOTE — Discharge Instructions (Signed)

## 2023-03-02 ENCOUNTER — Encounter (HOSPITAL_COMMUNITY): Payer: Self-pay | Admitting: Gastroenterology

## 2023-03-03 LAB — CYTOLOGY - NON PAP

## 2023-03-14 ENCOUNTER — Other Ambulatory Visit: Payer: Self-pay | Admitting: Family Medicine

## 2023-03-14 DIAGNOSIS — Z1231 Encounter for screening mammogram for malignant neoplasm of breast: Secondary | ICD-10-CM

## 2023-03-15 ENCOUNTER — Ambulatory Visit
Admission: RE | Admit: 2023-03-15 | Discharge: 2023-03-15 | Disposition: A | Discharge status: Home / Self Care | Payer: Medicare Other | Source: Ambulatory Visit | Attending: Family Medicine | Admitting: Family Medicine

## 2023-03-15 DIAGNOSIS — Z1231 Encounter for screening mammogram for malignant neoplasm of breast: Secondary | ICD-10-CM | POA: Diagnosis not present

## 2023-03-17 DIAGNOSIS — R7303 Prediabetes: Secondary | ICD-10-CM | POA: Diagnosis not present

## 2023-03-17 DIAGNOSIS — E039 Hypothyroidism, unspecified: Secondary | ICD-10-CM | POA: Diagnosis not present

## 2023-03-22 DIAGNOSIS — I10 Essential (primary) hypertension: Secondary | ICD-10-CM | POA: Diagnosis not present

## 2023-03-22 DIAGNOSIS — R7301 Impaired fasting glucose: Secondary | ICD-10-CM | POA: Diagnosis not present

## 2023-03-22 DIAGNOSIS — R7303 Prediabetes: Secondary | ICD-10-CM | POA: Diagnosis not present

## 2023-03-22 DIAGNOSIS — E039 Hypothyroidism, unspecified: Secondary | ICD-10-CM | POA: Diagnosis not present

## 2023-03-22 DIAGNOSIS — E782 Mixed hyperlipidemia: Secondary | ICD-10-CM | POA: Diagnosis not present

## 2023-03-22 DIAGNOSIS — I2584 Coronary atherosclerosis due to calcified coronary lesion: Secondary | ICD-10-CM | POA: Diagnosis not present

## 2023-03-22 DIAGNOSIS — I2510 Atherosclerotic heart disease of native coronary artery without angina pectoris: Secondary | ICD-10-CM | POA: Diagnosis not present

## 2023-03-22 DIAGNOSIS — Z8673 Personal history of transient ischemic attack (TIA), and cerebral infarction without residual deficits: Secondary | ICD-10-CM | POA: Diagnosis not present

## 2023-03-22 DIAGNOSIS — N952 Postmenopausal atrophic vaginitis: Secondary | ICD-10-CM | POA: Diagnosis not present

## 2023-03-22 DIAGNOSIS — Z6827 Body mass index (BMI) 27.0-27.9, adult: Secondary | ICD-10-CM | POA: Diagnosis not present

## 2023-03-22 DIAGNOSIS — K869 Disease of pancreas, unspecified: Secondary | ICD-10-CM | POA: Diagnosis not present

## 2023-03-23 ENCOUNTER — Other Ambulatory Visit (HOSPITAL_BASED_OUTPATIENT_CLINIC_OR_DEPARTMENT_OTHER): Payer: Self-pay | Admitting: Family Medicine

## 2023-03-23 DIAGNOSIS — I2510 Atherosclerotic heart disease of native coronary artery without angina pectoris: Secondary | ICD-10-CM

## 2023-04-04 ENCOUNTER — Ambulatory Visit
Admission: RE | Admit: 2023-04-04 | Discharge: 2023-04-04 | Disposition: A | Discharge status: Home / Self Care | Payer: Medicare Other | Source: Ambulatory Visit | Attending: Urgent Care | Admitting: Urgent Care

## 2023-04-04 VITALS — BP 133/75 | HR 58 | Temp 97.6°F | Resp 16

## 2023-04-04 DIAGNOSIS — R0789 Other chest pain: Secondary | ICD-10-CM

## 2023-04-04 MED ORDER — LIDOCAINE VISCOUS HCL 2 % MT SOLN
15.0000 mL | Freq: Once | OROMUCOSAL | Status: AC
  Administered 2023-04-04: 15 mL via ORAL

## 2023-04-04 MED ORDER — ALUM & MAG HYDROXIDE-SIMETH 200-200-20 MG/5ML PO SUSP
30.0000 mL | Freq: Once | ORAL | Status: AC
  Administered 2023-04-04: 30 mL via ORAL

## 2023-04-04 NOTE — Discharge Instructions (Signed)
Your EKG looks normal and unchanged compared to your previous EKG. I suspect your symptoms to be muscular in nature. Please apply a microwaveable heat pack or a warm towel over the affected area of your chest. Please take 650-100mg  of tylenol every 8 hours to see if this helps. If you develop worsening or more sustained pain, if the pain radiates to your left shoulder or jaw, or if you develop sweating and nausea, please head to the ER.

## 2023-04-04 NOTE — ED Triage Notes (Addendum)
Pt presents to [facility] with co of Left sided chest pain sharp  pin point and intermittent since yesterday. No sob   Pt reports calcification in aorta and scheduled for ct follow up on the 24th

## 2023-04-04 NOTE — ED Provider Notes (Signed)
[facility]    CSN: [id number] Arrival date & time: 04/04/23  1310      History   Chief Complaint Chief Complaint  Patient presents with   Chest Pain    HPI Debra Herman is a 77 y.o. female.   Pleasant 77yo female presents today due to concerns of pains in her chest. She states it started yesterday, intermittently and sporadically. It is point tender around the xiphoid on the L with no radiation. She denies a mass or lump. States the pain comes without warning and lasts only a few seconds. It will be minutes to hours before the next episode. States it is sharp in nature. Denies injury or trauma. Was on a trip back from [place] the day before sx started. Denies diaphoresis or SOB. Does not worsen with activity or exertion. It is not positional. Denies GERD. Pt was concerned as she recently was told she has atherosclerosis in her arteries and is scheduled for a CT coronary calcium score soon. She has not tried any treatments.   Chest Pain   History reviewed. No pertinent past medical history.  Patient Active Problem List   Diagnosis Date Noted   S/P exploratory laparotomy 11/07/2022    Past Surgical History:  Procedure Laterality Date   BREAST CYST ASPIRATION Right    ESOPHAGOGASTRODUODENOSCOPY (EGD) WITH PROPOFOL Bilateral 03/01/2023   Procedure: ESOPHAGOGASTRODUODENOSCOPY (EGD) WITH PROPOFOL;  Surgeon: Willis Modena, MD;  Location: [facility];  Service: Gastroenterology;  Laterality: Bilateral;   FINE NEEDLE ASPIRATION N/A 03/01/2023   Procedure: FINE NEEDLE ASPIRATION (FNA) LINEAR;  Surgeon: Willis Modena, MD;  Location: [facility];  Service: Gastroenterology;  Laterality: N/A;   LAPAROTOMY N/A 11/07/2022   Procedure: EXPLORATORY LAPAROTOMY LYSIS OF ADHESIONS;  Surgeon: Emelia Loron, MD;  Location: [facility];  Service: General;  Laterality: N/A;   UPPER ESOPHAGEAL ENDOSCOPIC ULTRASOUND (EUS) Bilateral 03/01/2023   Procedure: UPPER ESOPHAGEAL  ENDOSCOPIC ULTRASOUND (EUS);  Surgeon: Willis Modena, MD;  Location: [facility];  Service: Gastroenterology;  Laterality: Bilateral;    OB History   No obstetric history on file.      Home Medications    Prior to Admission medications   Medication Sig Start Date End Date Taking? Authorizing Provider  acetaminophen (TYLENOL) 500 MG tablet Take 2 tablets (1,000 mg total) by mouth every 6 (six) hours as needed. 11/10/22   Barnetta Chapel, PA-C  atorvastatin (LIPITOR) 20 MG tablet Take 20 mg by mouth every evening.    [provider]  bisoprolol-hydrochlorothiazide (ZIAC) 5-6.25 MG tablet Take 1 tablet by mouth daily.    [provider]  cholecalciferol (VITAMIN D3) 25 MCG (1000 UNIT) tablet Take 1,000 Units by mouth daily.    [provider]  clopidogrel (PLAVIX) 75 MG tablet Take 1 tablet (75 mg total) by mouth every evening. 03/03/23   Willis Modena, MD  ESTRACE VAGINAL 0.1 MG/GM vaginal cream Place 1 Applicatorful vaginally 2 (two) times a week.    [provider]  levothyroxine (SYNTHROID) 75 MCG tablet Take 75-112.5 mcg by mouth See admin instructions. Takes daily except on Fridays takes 112.11mcg. 04/16/13   [provider]  Multiple Vitamins-Minerals (ICAPS AREDS 2 PO) Take 1 tablet by mouth in the morning and at bedtime.    [provider]    Family History Family History  Problem Relation Age of Onset   Breast cancer Neg Hx     Social History Social History   Tobacco Use   Smoking status:  Never   Smokeless tobacco: Never     Allergies   Sulfa antibiotics and Latex   Review of Systems Review of Systems  Cardiovascular:  Positive for chest pain.  As per HPI   Physical Exam Triage Vital Signs ED Triage Vitals  Enc Vitals Group     BP 04/04/23 1323 133/75     Pulse Rate 04/04/23 1323 (!) 58     Resp 04/04/23 1323 16     Temp 04/04/23 1323 97.6 F (36.4 C)     Temp src --      SpO2 04/04/23 1323 98  %     Weight --      Height --      Head Circumference --      Peak Flow --      Pain Score 04/04/23 1322 4     Pain Loc --      Pain Edu? --      Excl. in GC? --    No data found.  Updated Vital Signs BP 133/75   Pulse (!) 58   Temp 97.6 F (36.4 C)   Resp 16   LMP  (LMP Unknown)   SpO2 98%   Visual Acuity Right Eye Distance:   Left Eye Distance:   Bilateral Distance:    Right Eye Near:   Left Eye Near:    Bilateral Near:     Physical Exam Vitals and nursing note reviewed.  Constitutional:      General: She is not in acute distress.    Appearance: Normal appearance. She is well-developed and normal weight. She is not ill-appearing, toxic-appearing or diaphoretic.  HENT:     Head: Normocephalic and atraumatic.     Right Ear: External ear normal.     Left Ear: External ear normal.     Mouth/Throat:     Mouth: Mucous membranes are moist.  Eyes:     General: No scleral icterus.       Right eye: No discharge.        Left eye: No discharge.     Extraocular Movements: Extraocular movements intact.     Pupils: Pupils are equal, round, and reactive to light.  Cardiovascular:     Rate and Rhythm: Regular rhythm. Bradycardia present.     Heart sounds: Murmur heard.     Systolic murmur is present with a grade of 1/6.     No diastolic murmur is present.     No friction rub. No S3 or S4 sounds.  Pulmonary:     Effort: Pulmonary effort is normal. No respiratory distress.     Breath sounds: Normal breath sounds. No decreased breath sounds, wheezing, rhonchi or rales.  Chest:     Chest wall: Tenderness (noted to R xiphoid; unable to reproduce sx on L) present. No mass or crepitus.  Musculoskeletal:        General: Normal range of motion.     Cervical back: Normal range of motion and neck supple. No rigidity or tenderness.     Right lower leg: No edema.     Left lower leg: No edema.  Lymphadenopathy:     Cervical: No cervical adenopathy.  Skin:    General: Skin is  warm and dry.     Coloration: Skin is not jaundiced.     Findings: No ecchymosis or erythema.     Nails: There is no clubbing.  Neurological:     General: No focal deficit present.  Mental Status: She is alert and oriented to person, place, and time.      UC Treatments / Results  Labs (all labs ordered are listed, but only abnormal results are displayed) Labs Reviewed - No data to display  EKG   Radiology No results found.  Procedures ED EKG  Date/Time: 04/04/2023 1:40 PM  Performed by: Maretta Bees, PA Authorized by: Maretta Bees, PA   Previous ECG:    Previous ECG:  Compared to current   Similarity:  No change   Comparison ECG info:  Jan 2024 Interpretation:    Interpretation: non-specific   Rate:    ECG rate:  56   ECG rate assessment: bradycardic   Rhythm:    Rhythm: sinus bradycardia   Ectopy:    Ectopy: none   ST segments:    ST segments:  Normal T waves:    T waves: normal   Q waves:    Abnormal Q-waves: not present    (including critical care time)  Medications Ordered in UC Medications  alum & mag hydroxide-simeth (MAALOX/MYLANTA) 200-200-20 MG/5ML suspension 30 mL (30 mLs Oral Given 04/04/23 1342)    And  lidocaine (XYLOCAINE) 2 % viscous mouth solution 15 mL (15 mLs Oral Given 04/04/23 1342)    Initial Impression / Assessment and Plan / UC Course  I have reviewed the triage vital signs and the nursing notes.  Pertinent labs & imaging results that were available during my care of the patient were reviewed by me and considered in my medical decision making (see chart for details).     Chest wall pain -EKG performed in clinic shows no change compared to prior, no concerning findings.  Patient denying radiation to left shoulder or jaw, no shortness of breath or diaphoresis.  Patient appears very comfortable in room.  Have a very low index of suspicion for this being cardiac in nature.  Patient did have reproducible pain on the right,  question developing costochondritis.  Patient cannot take NSAIDs.  She did have a slight decrease in the severity of the pain after the GI cocktail, but symptoms were not resolved completely.  Will request she try Tylenol and a warm compress to the area to see if symptoms will improve/resolve.  We did go over strict ER precautions.   Final Clinical Impressions(s) / UC Diagnoses   Final diagnoses:  Chest wall pain     Discharge Instructions      Your EKG looks normal and unchanged compared to your previous EKG. I suspect your symptoms to be muscular in nature. Please apply a microwaveable heat pack or a warm towel over the affected area of your chest. Please take 650-100mg  of tylenol every 8 hours to see if this helps. If you develop worsening or more sustained pain, if the pain radiates to your left shoulder or jaw, or if you develop sweating and nausea, please head to the ER.     ED Prescriptions   None    PDMP not reviewed this encounter.   Maretta Bees, [place] 04/04/23 1650

## 2023-04-10 ENCOUNTER — Ambulatory Visit (HOSPITAL_COMMUNITY)
Admission: RE | Admit: 2023-04-10 | Discharge: 2023-04-10 | Disposition: A | Discharge status: Home / Self Care | Payer: Self-pay | Source: Ambulatory Visit | Attending: Family Medicine | Admitting: Family Medicine

## 2023-04-10 DIAGNOSIS — I2510 Atherosclerotic heart disease of native coronary artery without angina pectoris: Secondary | ICD-10-CM | POA: Insufficient documentation

## 2023-04-10 DIAGNOSIS — I2584 Coronary atherosclerosis due to calcified coronary lesion: Secondary | ICD-10-CM | POA: Insufficient documentation

## 2023-04-16 DIAGNOSIS — S00262A Insect bite (nonvenomous) of left eyelid and periocular area, initial encounter: Secondary | ICD-10-CM | POA: Diagnosis not present

## 2023-04-16 DIAGNOSIS — H109 Unspecified conjunctivitis: Secondary | ICD-10-CM | POA: Diagnosis not present

## 2023-04-28 DIAGNOSIS — H5711 Ocular pain, right eye: Secondary | ICD-10-CM | POA: Diagnosis not present

## 2023-04-28 DIAGNOSIS — H1031 Unspecified acute conjunctivitis, right eye: Secondary | ICD-10-CM | POA: Diagnosis not present

## 2023-05-05 DIAGNOSIS — H15102 Unspecified episcleritis, left eye: Secondary | ICD-10-CM | POA: Diagnosis not present

## 2023-05-26 ENCOUNTER — Encounter: Payer: Self-pay | Admitting: Cardiology

## 2023-05-26 ENCOUNTER — Ambulatory Visit: Payer: Medicare Other | Admitting: Cardiology

## 2023-05-26 VITALS — BP 120/72 | HR 56 | Resp 16 | Ht 61.0 in | Wt 142.0 lb

## 2023-05-26 DIAGNOSIS — I2584 Coronary atherosclerosis due to calcified coronary lesion: Secondary | ICD-10-CM | POA: Diagnosis not present

## 2023-05-26 DIAGNOSIS — Z87891 Personal history of nicotine dependence: Secondary | ICD-10-CM

## 2023-05-26 DIAGNOSIS — I10 Essential (primary) hypertension: Secondary | ICD-10-CM

## 2023-05-26 DIAGNOSIS — E782 Mixed hyperlipidemia: Secondary | ICD-10-CM

## 2023-05-26 DIAGNOSIS — R931 Abnormal findings on diagnostic imaging of heart and coronary circulation: Secondary | ICD-10-CM

## 2023-05-26 DIAGNOSIS — Z8673 Personal history of transient ischemic attack (TIA), and cerebral infarction without residual deficits: Secondary | ICD-10-CM | POA: Diagnosis not present

## 2023-05-26 DIAGNOSIS — R7303 Prediabetes: Secondary | ICD-10-CM

## 2023-05-26 NOTE — Progress Notes (Signed)
ID:  Debra Herman, DOB Dec 29, 1945, MRN [id number]  PCP:  Gweneth Dimitri, MD  Cardiologist:  Tessa Lerner, DO, [facility] (established care 05/26/23)  REASON FOR CONSULT: Coronary artery calcification  REQUESTING PHYSICIAN:  Gweneth Dimitri, MD [address] [place],  [place] [address]  Chief Complaint  Patient presents with   Establish Care    Coronary artery calcification    HPI  Debra Herman is a 77 y.o. Caucasian female who presents to the clinic for evaluation of Coronary artery calcification at the request of Gweneth Dimitri, MD. Her past medical history and cardiovascular risk factors include: Moderate coronary calcification, hypertension, hyperlipidemia, history of TIA 2014, migraine, hypothyroidism, former smoker.   Patient was referred to the practice for evaluation of coronary calcification.  She underwent PET scan in March 2024 for suspected pancreatic head lesion and on the imaging study was noted to have coronary artery calcification.  She underwent dedicated coronary calcium score for further risk stratification.  Her total CAC was 311, placing her at the 79th percentile.  Her PCP has started her on aspirin 81 mg p.o. daily and atorvastatin was increased from 20 mg to 40 mg p.o. nightly.  And now she is referred to cardiology for further evaluation and management.  Since noticing the coronary calcification she started going back to the gym along with her husband.  They go 6 days a week for 30 minutes each -sometimes will do treadmill, stationary bike, and/or free weights.  She feels well after working out.  CARDIAC DATABASE: EKG: 05/26/2023: Sinus bradycardia, 56 bpm, without underlying injury pattern.  Echocardiogram: No results found for this or any previous visit from the past 1095 days.   Stress Testing: No results found for this or any previous visit from the past 1095 days.  CT Cardiac Scoring: 04/10/2023 Left main: 0 Left anterior descending artery:  276 Left circumflex artery: 31.4 Right coronary artery: 3.31   Total: 311 Percentile: 79th   Radiology over read: Mild right upper lobe atelectasis. Dilatation of the ascending aorta 3.9 cm   ALLERGIES: Allergies  Allergen Reactions   Sulfa Antibiotics Itching   Latex Hives and Rash    MEDICATION LIST PRIOR TO VISIT: Current Meds  Medication Sig   acetaminophen (TYLENOL) 500 MG tablet Take 2 tablets (1,000 mg total) by mouth every 6 (six) hours as needed.   atorvastatin (LIPITOR) 40 MG tablet Take 40 mg by mouth every evening.   bisoprolol-hydrochlorothiazide (ZIAC) 5-6.25 MG tablet Take 1 tablet by mouth daily.   cholecalciferol (VITAMIN D3) 25 MCG (1000 UNIT) tablet Take 1,000 Units by mouth daily.   clopidogrel (PLAVIX) 75 MG tablet Take 1 tablet (75 mg total) by mouth every evening.   ESTRACE VAGINAL 0.1 MG/GM vaginal cream Place 1 Applicatorful vaginally 2 (two) times a week.   levothyroxine (SYNTHROID) 75 MCG tablet Take 75-112.5 mcg by mouth See admin instructions. Takes daily except on Fridays takes 112.69mcg.   Multiple Vitamins-Minerals (ICAPS AREDS 2 PO) Take 1 tablet by mouth in the morning and at bedtime.   [DISCONTINUED] aspirin 81 MG chewable tablet Chew 81 mg by mouth daily.     PAST MEDICAL HISTORY: Past Medical History:  Diagnosis Date   Coronary atherosclerosis due to calcified coronary lesion    Hyperlipidemia    Hypertension    TIA (transient ischemic attack)     PAST SURGICAL HISTORY: Past Surgical History:  Procedure Laterality Date   BREAST CYST ASPIRATION Right    ESOPHAGOGASTRODUODENOSCOPY (EGD)  WITH PROPOFOL Bilateral 03/01/2023   Procedure: ESOPHAGOGASTRODUODENOSCOPY (EGD) WITH PROPOFOL;  Surgeon: Willis Modena, MD;  Location: [facility];  Service: Gastroenterology;  Laterality: Bilateral;   FINE NEEDLE ASPIRATION N/A 03/01/2023   Procedure: FINE NEEDLE ASPIRATION (FNA) LINEAR;  Surgeon: Willis Modena, MD;  Location: [facility];   Service: Gastroenterology;  Laterality: N/A;   LAPAROTOMY N/A 11/07/2022   Procedure: EXPLORATORY LAPAROTOMY LYSIS OF ADHESIONS;  Surgeon: Emelia Loron, MD;  Location: [facility];  Service: General;  Laterality: N/A;   UPPER ESOPHAGEAL ENDOSCOPIC ULTRASOUND (EUS) Bilateral 03/01/2023   Procedure: UPPER ESOPHAGEAL ENDOSCOPIC ULTRASOUND (EUS);  Surgeon: Willis Modena, MD;  Location: [facility];  Service: Gastroenterology;  Laterality: Bilateral;    FAMILY HISTORY: The patient family history includes Alcoholism in her father; Heart attack in her brother; Heart disease in her father; Liver disease in her father; Lung cancer in her mother; Multiple sclerosis in her sister.  SOCIAL HISTORY:  The patient  reports that she quit smoking about 43 years ago. Her smoking use included cigarettes. She has never used smokeless tobacco. She reports current alcohol use of about 2.0 standard drinks of alcohol per week. She reports that she does not currently use drugs after having used the following drugs: Marijuana and LSD.  REVIEW OF SYSTEMS: Review of Systems  Constitutional: Positive for malaise/fatigue.  Cardiovascular:  Negative for chest pain, claudication, dyspnea on exertion, irregular heartbeat, leg swelling, near-syncope, orthopnea, palpitations, paroxysmal nocturnal dyspnea and syncope.  Respiratory:  Negative for shortness of breath.   Hematologic/Lymphatic: Negative for bleeding problem.  Musculoskeletal:  Negative for muscle cramps and myalgias.  Neurological:  Negative for dizziness and light-headedness.    PHYSICAL EXAM:    05/26/2023   10:34 AM 04/04/2023    1:23 PM 03/01/2023   12:00 PM  Vitals with BMI  Height 5\' 1"     Weight 142 lbs    BMI 26.84    Systolic 120 133 914  Diastolic 72 75 64  Pulse 56 58 52    Physical Exam  Constitutional: No distress.  Age appropriate, hemodynamically stable.   Neck: No JVD present.  Cardiovascular: Normal rate, regular rhythm, S1 normal,  S2 normal, intact distal pulses and normal pulses. Exam reveals no gallop, no S3 and no S4.  No murmur heard. Pulmonary/Chest: Effort normal and breath sounds normal. No stridor. She has no wheezes. She has no rales.  Abdominal: Soft. Bowel sounds are normal. She exhibits no distension. There is no abdominal tenderness.  Musculoskeletal:        General: No edema.     Cervical back: Neck supple.  Neurological: She is alert and oriented to person, place, and time. She has intact cranial nerves (2-12).  Skin: Skin is warm and moist.     LABORATORY DATA:    Latest Ref Rng & Units 11/08/2022    4:57 AM 11/07/2022    5:02 AM 11/06/2022   10:05 PM  CBC  WBC 4.0 - 10.5 K/uL 8.8  10.4  13.7   Hemoglobin 12.0 - 15.0 g/dL 78.2  95.6  21.3   Hematocrit 36.0 - 46.0 % 38.5  48.1  47.2   Platelets 150 - 400 K/uL 151  177  193        Latest Ref Rng & Units 11/08/2022    4:57 AM 11/07/2022    5:02 AM 11/06/2022   10:05 PM  CMP  Glucose 70 - 99 mg/dL 086  578  469   BUN 8 - 23 mg/dL 9  13  16   Creatinine 0.44 - 1.00 mg/dL 6.04  5.40  9.81   Sodium 135 - 145 mmol/L 139  135  136   Potassium 3.5 - 5.1 mmol/L 3.5  3.8  3.3   Chloride 98 - 111 mmol/L 110  105  98   CO2 22 - 32 mmol/L 24  20  24    Calcium 8.9 - 10.3 mg/dL 8.3  8.4  9.6   Total Protein 6.5 - 8.1 g/dL   8.2   Total Bilirubin 0.3 - 1.2 mg/dL   1.1   Alkaline Phos 38 - 126 U/L   66   AST 15 - 41 U/L   55   ALT 0 - 44 U/L   25     No results found for: "CHOL", "HDL", "LDLCALC", "LDLDIRECT", "TRIG", "CHOLHDL" No components found for: "NTPROBNP" No results for input(s): "PROBNP" in the last 8760 hours. No results for input(s): "TSH" in the last 8760 hours.  BMP Recent Labs    11/06/22 2205 11/07/22 0502 11/08/22 0457  NA 136 135 139  K 3.3* 3.8 3.5  CL 98 105 110  CO2 24 20* 24  GLUCOSE 167* 160* 109*  BUN 16 13 9   CREATININE 0.63 0.64 0.57  CALCIUM 9.6 8.4* 8.3*  GFRNONAA >60 >60 >60    HEMOGLOBIN A1C No results  found for: "HGBA1C", "MPG"  External Labs: Collected: 09/16/2022 provided by PCP. Total cholesterol 144, triglycerides 86, HDL 51, calculated LDL 76, non-HDL 93.  Collected: 11/17/2022: BUN 16, creatinine 0.8. Sodium 139, potassium 4.3, chloride 103, bicarb 29. AST 56 (above normal limits). ALT 35, alkaline phosphatase 60   Collected: Mar 17, 2023 provided by PCP. A1c 5.5. TSH 1.3  IMPRESSION:    ICD-10-CM   1. Elevated coronary artery calcium score  R93.1 EKG 12-Lead    2. Prediabetes  R73.03     3. Hx-TIA (transient ischemic attack)  Z86.73     4. Benign hypertension  I10     5. Mixed hyperlipidemia  E78.2     6. Former smoker  Z87.891        RECOMMENDATIONS: Debra Herman is a 77 y.o. Caucasian female whose past medical history and cardiac risk factors include: Moderate coronary calcification, hypertension, hyperlipidemia, history of TIA 2014, migraine, hypothyroidism, former smoker.   Coronary atherosclerosis due to severely calcified coronary lesion Currently on aspirin and Plavix. Discontinue aspirin 81 mg p.o. daily and continue Plavix. Agree with uptitration of statin therapy -Lipitor increased from 20 mg p.o. nightly to 40 mg p.o. nightly by PCP. Recommend having repeat labs in 6 weeks to reevaluate lipids and LFTs-will defer to PCP. Echo will be ordered to evaluate for structural heart disease and left ventricular systolic function. Exercise nuclear stress test to evaluate for functional capacity and reversible ischemia.  Pharmacological stress test is requested given the fact the majority of the calcium burden is in the LAD distribution. Reemphasized the importance of secondary prevention with focus on improving her modifiable cardiovascular risk factors such as glycemic control, lipid management, blood pressure control, weight loss.  Hx-TIA (transient ischemic attack) Has been on Plavix since her index event. Continue statin therapy as discussed  above. Secondary prevention.  Benign hypertension Office blood pressures are well-controlled. No changes warranted at this time  Mixed hyperlipidemia Currently on atorvastatin.   She denies myalgia or other side effects. Most recent lipids dated December 2023, independently reviewed as noted above. Currently managed by primary care provider.  FINAL MEDICATION  LIST END OF ENCOUNTER: No orders of the defined types were placed in this encounter.   Medications Discontinued During This Encounter  Medication Reason   aspirin 81 MG chewable tablet      Current Outpatient Medications:    acetaminophen (TYLENOL) 500 MG tablet, Take 2 tablets (1,000 mg total) by mouth every 6 (six) hours as needed., Disp: 30 tablet, Rfl: 0   atorvastatin (LIPITOR) 40 MG tablet, Take 40 mg by mouth every evening., Disp: , Rfl:    bisoprolol-hydrochlorothiazide (ZIAC) 5-6.25 MG tablet, Take 1 tablet by mouth daily., Disp: , Rfl:    cholecalciferol (VITAMIN D3) 25 MCG (1000 UNIT) tablet, Take 1,000 Units by mouth daily., Disp: , Rfl:    clopidogrel (PLAVIX) 75 MG tablet, Take 1 tablet (75 mg total) by mouth every evening., Disp: , Rfl:    ESTRACE VAGINAL 0.1 MG/GM vaginal cream, Place 1 Applicatorful vaginally 2 (two) times a week., Disp: , Rfl:    levothyroxine (SYNTHROID) 75 MCG tablet, Take 75-112.5 mcg by mouth See admin instructions. Takes daily except on Fridays takes 112.74mcg., Disp: , Rfl:    Multiple Vitamins-Minerals (ICAPS AREDS 2 PO), Take 1 tablet by mouth in the morning and at bedtime., Disp: , Rfl:   Orders Placed This Encounter  Procedures   EKG 12-Lead    There are no Patient Instructions on file for this visit.   --Continue cardiac medications as reconciled in final medication list. --Return in about 8 weeks (around 07/21/2023) for Follow up, Coronary artery calcification, Review test results. or sooner if needed. --Continue follow-up with your primary care physician regarding the  management of your other chronic comorbid conditions.  Patient's questions and concerns were addressed to her satisfaction. She voices understanding of the instructions provided during this encounter.   This note was created using a voice recognition software as a result there may be grammatical errors inadvertently enclosed that do not reflect the nature of this encounter. Every attempt is made to correct such errors.  Tessa Lerner, [place], [facility]  Pager:  [phone] Office: [phone]

## 2023-06-07 DIAGNOSIS — H04123 Dry eye syndrome of bilateral lacrimal glands: Secondary | ICD-10-CM | POA: Diagnosis not present

## 2023-06-07 DIAGNOSIS — H43813 Vitreous degeneration, bilateral: Secondary | ICD-10-CM | POA: Diagnosis not present

## 2023-06-07 DIAGNOSIS — H524 Presbyopia: Secondary | ICD-10-CM | POA: Diagnosis not present

## 2023-06-07 DIAGNOSIS — H353112 Nonexudative age-related macular degeneration, right eye, intermediate dry stage: Secondary | ICD-10-CM | POA: Diagnosis not present

## 2023-06-07 DIAGNOSIS — H52203 Unspecified astigmatism, bilateral: Secondary | ICD-10-CM | POA: Diagnosis not present

## 2023-06-08 ENCOUNTER — Ambulatory Visit: Payer: Medicare Other

## 2023-06-08 DIAGNOSIS — I10 Essential (primary) hypertension: Secondary | ICD-10-CM

## 2023-06-08 DIAGNOSIS — I2584 Coronary atherosclerosis due to calcified coronary lesion: Secondary | ICD-10-CM

## 2023-06-08 DIAGNOSIS — R931 Abnormal findings on diagnostic imaging of heart and coronary circulation: Secondary | ICD-10-CM | POA: Diagnosis not present

## 2023-06-08 DIAGNOSIS — Z8673 Personal history of transient ischemic attack (TIA), and cerebral infarction without residual deficits: Secondary | ICD-10-CM | POA: Diagnosis not present

## 2023-06-13 NOTE — Progress Notes (Signed)
 Called pt she understands

## 2023-06-15 ENCOUNTER — Ambulatory Visit: Payer: Medicare Other

## 2023-06-15 DIAGNOSIS — I10 Essential (primary) hypertension: Secondary | ICD-10-CM | POA: Diagnosis not present

## 2023-06-15 DIAGNOSIS — R931 Abnormal findings on diagnostic imaging of heart and coronary circulation: Secondary | ICD-10-CM

## 2023-06-15 DIAGNOSIS — I2584 Coronary atherosclerosis due to calcified coronary lesion: Secondary | ICD-10-CM | POA: Diagnosis not present

## 2023-06-15 DIAGNOSIS — Z8673 Personal history of transient ischemic attack (TIA), and cerebral infarction without residual deficits: Secondary | ICD-10-CM

## 2023-06-30 NOTE — Progress Notes (Signed)
Pt understands.

## 2023-07-17 DIAGNOSIS — E782 Mixed hyperlipidemia: Secondary | ICD-10-CM | POA: Diagnosis not present

## 2023-07-18 DIAGNOSIS — Z23 Encounter for immunization: Secondary | ICD-10-CM | POA: Diagnosis not present

## 2023-07-20 ENCOUNTER — Ambulatory Visit: Payer: Medicare Other | Attending: Cardiology | Admitting: Cardiology

## 2023-07-20 ENCOUNTER — Encounter: Payer: Self-pay | Admitting: Cardiology

## 2023-07-20 VITALS — BP 118/64 | HR 69 | Ht 61.0 in | Wt 142.4 lb

## 2023-07-20 DIAGNOSIS — E782 Mixed hyperlipidemia: Secondary | ICD-10-CM | POA: Insufficient documentation

## 2023-07-20 DIAGNOSIS — R7303 Prediabetes: Secondary | ICD-10-CM | POA: Diagnosis not present

## 2023-07-20 DIAGNOSIS — I10 Essential (primary) hypertension: Secondary | ICD-10-CM | POA: Diagnosis not present

## 2023-07-20 DIAGNOSIS — Z8673 Personal history of transient ischemic attack (TIA), and cerebral infarction without residual deficits: Secondary | ICD-10-CM | POA: Insufficient documentation

## 2023-07-20 DIAGNOSIS — Z87891 Personal history of nicotine dependence: Secondary | ICD-10-CM | POA: Diagnosis not present

## 2023-07-20 DIAGNOSIS — R931 Abnormal findings on diagnostic imaging of heart and coronary circulation: Secondary | ICD-10-CM | POA: Insufficient documentation

## 2023-07-20 NOTE — Patient Instructions (Signed)
Medication Instructions:   Your physician recommends that you continue on your current medications as directed. Please refer to the Current Medication list given to you today.  *If you need a refill on your cardiac medications before your next appointment, please call your pharmacy*    Follow-Up: At [facility], you and your health needs are our priority.  As part of our continuing mission to provide you with exceptional heart care, we have created designated Provider Care Teams.  These Care Teams include your primary Cardiologist (physician) and Advanced Practice Providers (APPs -  Physician Assistants and Nurse Practitioners) who all work together to provide you with the care you need, when you need it.  We recommend signing up for the patient portal called "MyChart".  Sign up information is provided on this After Visit Summary.  MyChart is used to connect with patients for Virtual Visits (Telemedicine).  Patients are able to view lab/test results, encounter notes, upcoming appointments, etc.  Non-urgent messages can be sent to your provider as well.   To learn more about what you can do with MyChart, go to [email or web address].    Your next appointment:   1 year(s)  Provider:   Tessa Lerner, DO

## 2023-07-20 NOTE — Progress Notes (Signed)
Cardiology Office Note:  .   Date:  07/20/2023  ID:  [name], DOB 1946-10-17, MRN [id number] PCP:  Gweneth Dimitri, MD  [facility] Providers Cardiologist:  Tessa Lerner, DO , [facility] (established care 05/26/2023) Electrophysiologist:  None  Click to update primary MD,subspecialty MD or APP then REFRESH:1}    History of Present Illness: .   Debra Herman is a 77 y.o. Caucasian female whose past medical history and cardiovascular risk factors includes: Coronary calcification, hypertension, hyperlipidemia, history of TIA 2014, migraine, hypothyroidism, former smoker.  Patient was referred to the practice for evaluation of coronary calcification.  In March 2024 she underwent PET scan for suspected pancreatic head lesion and was noted to have coronary calcification.  She underwent dedicated coronary calcium score which noted moderate CAC with a total score of 311, placing her at the 79th percentile.  Prior to establishing care patient was already placed on antiplatelet therapy as well as statins.  Her statin therapy was increased from atorvastatin 20 mg to 40 mg p.o. nightly by PCP.  At the last office visit the shared decision was to focus on improving her modifiable cardiovascular risk factors, and undergo echo and stress test.  Results reviewed with her in detail and noted below for further reference.  Since last office visit, denies anginal chest pain or heart failure symptoms.  She plans to visit her daughter in [place] in the coming months.  She did bring her labs from her PCP which noted improvement in LDL level after up titration of Lipitor.  Review of Systems: .   Review of Systems  Cardiovascular:  Negative for chest pain, claudication, dyspnea on exertion, irregular heartbeat, leg swelling, near-syncope, orthopnea, palpitations, paroxysmal nocturnal dyspnea and syncope.  Respiratory:  Negative for shortness of breath.   Hematologic/Lymphatic: Negative for bleeding  problem.  Musculoskeletal:  Negative for muscle cramps and myalgias.  Neurological:  Negative for dizziness and light-headedness.    Studies Reviewed:   CARDIAC DATABASE: EKG: 05/26/2023: Sinus bradycardia, 56 bpm, without underlying injury pattern.   Echocardiogram: 06/15/2023: Normal LV systolic function with visual EF 60-65%. Left ventricle cavity is normal in size. Normal left ventricular wall thickness. Normal global wall motion. Normal diastolic filling pattern, normal LAP.  Mild (Grade I) aortic regurgitation. Mild (Grade I) mitral regurgitation. No prior study for comparison.    Stress Testing: Exercise Myoview stress test 06/08/2023: Exercise nuclear stress test was performed using Bruce protocol.  1 Day Rest and Stress images. Exercise time 8 minutes 00 seconds on Bruce protocol, achieved 10.13 METS, 118% of APMHR. Stress ECG nondiagnostic for ischemia due to uninterpretable ECG due to lead/motion artifact. ST depressions in the inferolateral leads present 2 minutes into recovery. Normal myocardial perfusion in the presence of breast attenuation artifact.  No obvious evidence of reversible myocardial ischemia or prior infarct. Calculated LVEF 65%, visually appears hyperdynamic. Wall thickness is preserved, no obvious regional wall motion abnormalities. Low risk study.    CT Cardiac Scoring: 04/10/2023 Left main: 0 Left anterior descending artery: 276 Left circumflex artery: 31.4 Right coronary artery: 3.31   Total: 311 Percentile: 79th   Radiology over read: Mild right upper lobe atelectasis. Dilatation of the ascending aorta 3.9 cm  RADIOLOGY: NA  Risk Assessment/Calculations:   NA   Labs:       Latest Ref Rng & Units 11/08/2022    4:57 AM 11/07/2022    5:02 AM 11/06/2022   10:05 PM  CBC  WBC 4.0 - 10.5 K/uL  8.8  10.4  13.7   Hemoglobin 12.0 - 15.0 g/dL 29.5  62.1  30.8   Hematocrit 36.0 - 46.0 % 38.5  48.1  47.2   Platelets 150 - 400 K/uL 151  177   193        Latest Ref Rng & Units 11/08/2022    4:57 AM 11/07/2022    5:02 AM 11/06/2022   10:05 PM  BMP  Glucose 70 - 99 mg/dL 657  846  962   BUN 8 - 23 mg/dL 9  13  16    Creatinine 0.44 - 1.00 mg/dL 9.52  8.41  3.24   Sodium 135 - 145 mmol/L 139  135  136   Potassium 3.5 - 5.1 mmol/L 3.5  3.8  3.3   Chloride 98 - 111 mmol/L 110  105  98   CO2 22 - 32 mmol/L 24  20  24    Calcium 8.9 - 10.3 mg/dL 8.3  8.4  9.6       Latest Ref Rng & Units 11/08/2022    4:57 AM 11/07/2022    5:02 AM 11/06/2022   10:05 PM  CMP  Glucose 70 - 99 mg/dL 401  027  253   BUN 8 - 23 mg/dL 9  13  16    Creatinine 0.44 - 1.00 mg/dL 6.64  4.03  4.74   Sodium 135 - 145 mmol/L 139  135  136   Potassium 3.5 - 5.1 mmol/L 3.5  3.8  3.3   Chloride 98 - 111 mmol/L 110  105  98   CO2 22 - 32 mmol/L 24  20  24    Calcium 8.9 - 10.3 mg/dL 8.3  8.4  9.6   Total Protein 6.5 - 8.1 g/dL   8.2   Total Bilirubin 0.3 - 1.2 mg/dL   1.1   Alkaline Phos 38 - 126 U/L   66   AST 15 - 41 U/L   55   ALT 0 - 44 U/L   25     No results found for: "CHOL", "HDL", "LDLCALC", "LDLDIRECT", "TRIG", "CHOLHDL" No results for input(s): "LIPOA" in the last 8760 hours. No components found for: "NTPROBNP" No results for input(s): "PROBNP" in the last 8760 hours. No results for input(s): "TSH" in the last 8760 hours.  External Labs: Collected: 09/16/2022 provided by PCP. Total cholesterol 144, triglycerides 86, HDL 51, calculated LDL 76, non-HDL 93.   Collected: 11/17/2022: BUN 16, creatinine 0.8. Sodium 139, potassium 4.3, chloride 103, bicarb 29. AST 56 (above normal limits). ALT 35, alkaline phosphatase 60   Collected: Mar 17, 2023 provided by PCP. A1c 5.5. TSH 1.3  External Labs: Collected: 07/17/2023 provided by patient performed at PCP. Total cholesterol 124, triglycerides 81, HDL 54, LDL calculated 54, non-HDL 70 [name], [place]  Physical Exam:    Today's Vitals   07/20/23 0804  BP: 118/64  Pulse: 69  SpO2: 98%   Weight: 142 lb 6.4 oz (64.6 kg)  Height: 5\' 1"  (1.549 m)   Body mass index is 26.91 kg/m. Wt Readings from Last 3 Encounters:  07/20/23 142 lb 6.4 oz (64.6 kg)  05/26/23 142 lb (64.4 kg)  03/01/23 141 lb (64 kg)    Physical Exam  Constitutional: No distress.  Age appropriate, hemodynamically stable.   Neck: No JVD present.  Cardiovascular: Normal rate, regular rhythm, S1 normal, S2 normal, intact distal pulses and normal pulses. Exam reveals no gallop, no S3 and no S4.  No murmur heard. Pulmonary/Chest:  Effort normal and breath sounds normal. No stridor. She has no wheezes. She has no rales.  Abdominal: Soft. Bowel sounds are normal. She exhibits no distension. There is no abdominal tenderness.  Musculoskeletal:        General: No edema.     Cervical back: Neck supple.  Neurological: She is alert and oriented to person, place, and time. She has intact cranial nerves (2-12).  Skin: Skin is warm and moist.     Impression & Recommendation(s):  Impression:   ICD-10-CM   1. Elevated coronary artery calcium score  R93.1     2. Prediabetes  R73.03     3. Hx-TIA (transient ischemic attack)  Z86.73     4. Benign hypertension  I10     5. Mixed hyperlipidemia  E78.2     6. Former smoker  Z87.891        Recommendation(s):  Elevated coronary artery calcium score Moderate CAC Denies anginal chest pain or heart failure symptoms. Continue antiplatelet therapy and statin. Reemphasized the importance of secondary prevention with focus on improving her modifiable cardiovascular risk factors such as glycemic control, lipid management, blood pressure control, weight loss.  Prediabetes Re emphasize importance of glycemic control. Patient understands that if she becomes diabetic it is crucial to keep her A1c within acceptable limits.  Hx-TIA (transient ischemic attack) Continue antiplatelet therapy and statin. Secondary prevention.  Benign hypertension Office blood pressures are  very well-controlled. Continue bisoprolol/hydrochlorothiazide Reemphasize importance of low-salt diet  Mixed hyperlipidemia LDL currently at goal, <55 mg/dL. Outside labs independently reviewed. Does not endorse myalgias.  Orders Placed:  No orders of the defined types were placed in this encounter.  As part of medical decision making results of the echo, stress test,  were reviewed independently at today's visit.   Would like to see her back on an annual basis sooner if needed.  Prior to her next office visit patient is asked to bring in her recent labs for review and reference.  Final Medication List:   No orders of the defined types were placed in this encounter.   There are no discontinued medications.   Current Outpatient Medications:    acetaminophen (TYLENOL) 500 MG tablet, Take 2 tablets (1,000 mg total) by mouth every 6 (six) hours as needed., Disp: 30 tablet, Rfl: 0   atorvastatin (LIPITOR) 40 MG tablet, Take 40 mg by mouth every evening., Disp: , Rfl:    bisoprolol-hydrochlorothiazide (ZIAC) 5-6.25 MG tablet, Take 1 tablet by mouth daily., Disp: , Rfl:    cholecalciferol (VITAMIN D3) 25 MCG (1000 UNIT) tablet, Take 1,000 Units by mouth daily., Disp: , Rfl:    clopidogrel (PLAVIX) 75 MG tablet, Take 1 tablet (75 mg total) by mouth every evening., Disp: , Rfl:    ESTRACE VAGINAL 0.1 MG/GM vaginal cream, Place 1 Applicatorful vaginally 2 (two) times a week., Disp: , Rfl:    levothyroxine (SYNTHROID) 75 MCG tablet, Take 75-112.5 mcg by mouth See admin instructions. Takes daily except on Fridays takes 112.68mcg., Disp: , Rfl:    Multiple Vitamins-Minerals (ICAPS AREDS 2 PO), Take 1 tablet by mouth in the morning and at bedtime., Disp: , Rfl:   Consent:      NA  Disposition:   Return in about 1 year (around 07/19/2024) for Annual follow up visit, Coronary artery calcification. or sooner if needed.  Her questions and concerns were addressed to her satisfaction. She  voices understanding of the recommendations provided during this encounter.    Signed,  Tessa Lerner, DO, [facility]  [address] [place], [place] [address] [phone] 07/20/2023 8:37 AM

## 2023-07-21 ENCOUNTER — Ambulatory Visit: Payer: Medicare Other | Admitting: Cardiology

## 2023-07-22 DIAGNOSIS — Z23 Encounter for immunization: Secondary | ICD-10-CM | POA: Diagnosis not present

## 2023-08-15 ENCOUNTER — Other Ambulatory Visit: Payer: Self-pay | Admitting: Gastroenterology

## 2023-08-15 DIAGNOSIS — K869 Disease of pancreas, unspecified: Secondary | ICD-10-CM

## 2023-09-04 ENCOUNTER — Ambulatory Visit
Admission: RE | Admit: 2023-09-04 | Discharge: 2023-09-04 | Disposition: A | Discharge status: Home / Self Care | Payer: Medicare Other | Source: Ambulatory Visit | Attending: Gastroenterology | Admitting: Gastroenterology

## 2023-09-04 DIAGNOSIS — K869 Disease of pancreas, unspecified: Secondary | ICD-10-CM

## 2023-09-04 DIAGNOSIS — K862 Cyst of pancreas: Secondary | ICD-10-CM | POA: Diagnosis not present

## 2023-09-04 DIAGNOSIS — K838 Other specified diseases of biliary tract: Secondary | ICD-10-CM | POA: Diagnosis not present

## 2023-09-04 MED ORDER — GADOPICLENOL 0.5 MMOL/ML IV SOLN
6.0000 mL | Freq: Once | INTRAVENOUS | Status: AC | PRN
  Administered 2023-09-04: 6 mL via INTRAVENOUS

## 2023-09-18 DIAGNOSIS — Z1331 Encounter for screening for depression: Secondary | ICD-10-CM | POA: Diagnosis not present

## 2023-09-18 DIAGNOSIS — Z0000 Encounter for general adult medical examination without abnormal findings: Secondary | ICD-10-CM | POA: Diagnosis not present

## 2023-09-18 DIAGNOSIS — Z6827 Body mass index (BMI) 27.0-27.9, adult: Secondary | ICD-10-CM | POA: Diagnosis not present

## 2023-09-19 DIAGNOSIS — R7303 Prediabetes: Secondary | ICD-10-CM | POA: Diagnosis not present

## 2023-09-19 DIAGNOSIS — E782 Mixed hyperlipidemia: Secondary | ICD-10-CM | POA: Diagnosis not present

## 2023-09-19 DIAGNOSIS — E039 Hypothyroidism, unspecified: Secondary | ICD-10-CM | POA: Diagnosis not present

## 2023-09-21 DIAGNOSIS — Z01419 Encounter for gynecological examination (general) (routine) without abnormal findings: Secondary | ICD-10-CM | POA: Diagnosis not present

## 2023-09-21 DIAGNOSIS — E039 Hypothyroidism, unspecified: Secondary | ICD-10-CM | POA: Diagnosis not present

## 2023-09-21 DIAGNOSIS — Z8673 Personal history of transient ischemic attack (TIA), and cerebral infarction without residual deficits: Secondary | ICD-10-CM | POA: Diagnosis not present

## 2023-09-21 DIAGNOSIS — R7303 Prediabetes: Secondary | ICD-10-CM | POA: Diagnosis not present

## 2023-09-21 DIAGNOSIS — N952 Postmenopausal atrophic vaginitis: Secondary | ICD-10-CM | POA: Diagnosis not present

## 2023-09-21 DIAGNOSIS — I10 Essential (primary) hypertension: Secondary | ICD-10-CM | POA: Diagnosis not present

## 2023-09-21 DIAGNOSIS — E782 Mixed hyperlipidemia: Secondary | ICD-10-CM | POA: Diagnosis not present

## 2023-09-21 DIAGNOSIS — I2510 Atherosclerotic heart disease of native coronary artery without angina pectoris: Secondary | ICD-10-CM | POA: Diagnosis not present

## 2023-09-21 DIAGNOSIS — Z6827 Body mass index (BMI) 27.0-27.9, adult: Secondary | ICD-10-CM | POA: Diagnosis not present

## 2023-09-21 DIAGNOSIS — K869 Disease of pancreas, unspecified: Secondary | ICD-10-CM | POA: Diagnosis not present

## 2023-09-27 DIAGNOSIS — I10 Essential (primary) hypertension: Secondary | ICD-10-CM | POA: Diagnosis not present

## 2023-09-27 DIAGNOSIS — R7303 Prediabetes: Secondary | ICD-10-CM | POA: Diagnosis not present

## 2023-09-27 DIAGNOSIS — E782 Mixed hyperlipidemia: Secondary | ICD-10-CM | POA: Diagnosis not present

## 2023-09-27 DIAGNOSIS — Z713 Dietary counseling and surveillance: Secondary | ICD-10-CM | POA: Diagnosis not present

## 2023-09-27 DIAGNOSIS — E039 Hypothyroidism, unspecified: Secondary | ICD-10-CM | POA: Diagnosis not present

## 2023-11-11 DIAGNOSIS — N390 Urinary tract infection, site not specified: Secondary | ICD-10-CM | POA: Diagnosis not present

## 2024-01-02 DIAGNOSIS — L814 Other melanin hyperpigmentation: Secondary | ICD-10-CM | POA: Diagnosis not present

## 2024-01-02 DIAGNOSIS — L821 Other seborrheic keratosis: Secondary | ICD-10-CM | POA: Diagnosis not present

## 2024-01-02 DIAGNOSIS — D225 Melanocytic nevi of trunk: Secondary | ICD-10-CM | POA: Diagnosis not present

## 2024-01-02 DIAGNOSIS — L578 Other skin changes due to chronic exposure to nonionizing radiation: Secondary | ICD-10-CM | POA: Diagnosis not present

## 2024-01-30 ENCOUNTER — Other Ambulatory Visit: Payer: Self-pay | Admitting: Family Medicine

## 2024-01-30 DIAGNOSIS — Z1231 Encounter for screening mammogram for malignant neoplasm of breast: Secondary | ICD-10-CM

## 2024-03-18 ENCOUNTER — Ambulatory Visit
Admission: RE | Admit: 2024-03-18 | Discharge: 2024-03-18 | Disposition: A | Discharge status: Home / Self Care | Source: Ambulatory Visit | Attending: Family Medicine | Admitting: Family Medicine

## 2024-03-18 DIAGNOSIS — Z1231 Encounter for screening mammogram for malignant neoplasm of breast: Secondary | ICD-10-CM

## 2024-03-20 DIAGNOSIS — E782 Mixed hyperlipidemia: Secondary | ICD-10-CM | POA: Diagnosis not present

## 2024-03-20 DIAGNOSIS — R7303 Prediabetes: Secondary | ICD-10-CM | POA: Diagnosis not present

## 2024-03-20 DIAGNOSIS — E039 Hypothyroidism, unspecified: Secondary | ICD-10-CM | POA: Diagnosis not present

## 2024-03-27 DIAGNOSIS — I10 Essential (primary) hypertension: Secondary | ICD-10-CM | POA: Diagnosis not present

## 2024-03-27 DIAGNOSIS — R7989 Other specified abnormal findings of blood chemistry: Secondary | ICD-10-CM | POA: Diagnosis not present

## 2024-03-27 DIAGNOSIS — M25511 Pain in right shoulder: Secondary | ICD-10-CM | POA: Diagnosis not present

## 2024-03-27 DIAGNOSIS — R7303 Prediabetes: Secondary | ICD-10-CM | POA: Diagnosis not present

## 2024-03-27 DIAGNOSIS — E039 Hypothyroidism, unspecified: Secondary | ICD-10-CM | POA: Diagnosis not present

## 2024-03-27 DIAGNOSIS — N952 Postmenopausal atrophic vaginitis: Secondary | ICD-10-CM | POA: Diagnosis not present

## 2024-03-27 DIAGNOSIS — Z6826 Body mass index (BMI) 26.0-26.9, adult: Secondary | ICD-10-CM | POA: Diagnosis not present

## 2024-03-27 DIAGNOSIS — Z8673 Personal history of transient ischemic attack (TIA), and cerebral infarction without residual deficits: Secondary | ICD-10-CM | POA: Diagnosis not present

## 2024-03-27 DIAGNOSIS — I2510 Atherosclerotic heart disease of native coronary artery without angina pectoris: Secondary | ICD-10-CM | POA: Diagnosis not present

## 2024-07-04 DIAGNOSIS — W19XXXA Unspecified fall, initial encounter: Secondary | ICD-10-CM | POA: Diagnosis not present

## 2024-07-04 DIAGNOSIS — Z87891 Personal history of nicotine dependence: Secondary | ICD-10-CM | POA: Diagnosis not present

## 2024-07-04 DIAGNOSIS — M62838 Other muscle spasm: Secondary | ICD-10-CM | POA: Diagnosis not present

## 2024-07-04 DIAGNOSIS — S0083XA Contusion of other part of head, initial encounter: Secondary | ICD-10-CM | POA: Diagnosis not present

## 2024-07-04 DIAGNOSIS — M62830 Muscle spasm of back: Secondary | ICD-10-CM | POA: Diagnosis not present

## 2024-07-04 DIAGNOSIS — S0081XA Abrasion of other part of head, initial encounter: Secondary | ICD-10-CM | POA: Diagnosis not present

## 2024-07-12 DIAGNOSIS — R2689 Other abnormalities of gait and mobility: Secondary | ICD-10-CM | POA: Diagnosis not present

## 2024-07-12 DIAGNOSIS — N952 Postmenopausal atrophic vaginitis: Secondary | ICD-10-CM | POA: Diagnosis not present

## 2024-07-12 DIAGNOSIS — Z9181 History of falling: Secondary | ICD-10-CM | POA: Diagnosis not present

## 2024-07-12 DIAGNOSIS — S20219D Contusion of unspecified front wall of thorax, subsequent encounter: Secondary | ICD-10-CM | POA: Diagnosis not present

## 2024-07-12 DIAGNOSIS — Z6825 Body mass index (BMI) 25.0-25.9, adult: Secondary | ICD-10-CM | POA: Diagnosis not present

## 2024-07-12 DIAGNOSIS — Z23 Encounter for immunization: Secondary | ICD-10-CM | POA: Diagnosis not present

## 2024-07-26 DIAGNOSIS — R262 Difficulty in walking, not elsewhere classified: Secondary | ICD-10-CM | POA: Diagnosis not present

## 2024-07-29 DIAGNOSIS — Z23 Encounter for immunization: Secondary | ICD-10-CM | POA: Diagnosis not present

## 2024-08-08 DIAGNOSIS — R262 Difficulty in walking, not elsewhere classified: Secondary | ICD-10-CM | POA: Diagnosis not present

## 2024-08-15 DIAGNOSIS — R262 Difficulty in walking, not elsewhere classified: Secondary | ICD-10-CM | POA: Diagnosis not present

## 2024-08-23 DIAGNOSIS — R262 Difficulty in walking, not elsewhere classified: Secondary | ICD-10-CM | POA: Diagnosis not present

## 2024-08-30 DIAGNOSIS — R262 Difficulty in walking, not elsewhere classified: Secondary | ICD-10-CM | POA: Diagnosis not present

## 2024-09-06 DIAGNOSIS — R262 Difficulty in walking, not elsewhere classified: Secondary | ICD-10-CM | POA: Diagnosis not present

## 2024-09-11 DIAGNOSIS — R262 Difficulty in walking, not elsewhere classified: Secondary | ICD-10-CM | POA: Diagnosis not present

## 2024-09-19 ENCOUNTER — Emergency Department (HOSPITAL_COMMUNITY)

## 2024-09-19 ENCOUNTER — Other Ambulatory Visit: Payer: Self-pay

## 2024-09-19 ENCOUNTER — Encounter (HOSPITAL_COMMUNITY): Payer: Self-pay

## 2024-09-19 ENCOUNTER — Observation Stay (HOSPITAL_COMMUNITY)
Admission: EM | Admit: 2024-09-19 | Discharge: 2024-09-21 | Disposition: A | Attending: Internal Medicine | Admitting: Internal Medicine

## 2024-09-19 DIAGNOSIS — E039 Hypothyroidism, unspecified: Secondary | ICD-10-CM | POA: Diagnosis not present

## 2024-09-19 DIAGNOSIS — I10 Essential (primary) hypertension: Secondary | ICD-10-CM | POA: Insufficient documentation

## 2024-09-19 DIAGNOSIS — Z91040 Latex allergy status: Secondary | ICD-10-CM | POA: Diagnosis not present

## 2024-09-19 DIAGNOSIS — R262 Difficulty in walking, not elsewhere classified: Secondary | ICD-10-CM | POA: Diagnosis not present

## 2024-09-19 DIAGNOSIS — R4701 Aphasia: Secondary | ICD-10-CM | POA: Diagnosis not present

## 2024-09-19 DIAGNOSIS — G459 Transient cerebral ischemic attack, unspecified: Secondary | ICD-10-CM | POA: Diagnosis not present

## 2024-09-19 DIAGNOSIS — Z87891 Personal history of nicotine dependence: Secondary | ICD-10-CM | POA: Diagnosis not present

## 2024-09-19 DIAGNOSIS — E785 Hyperlipidemia, unspecified: Secondary | ICD-10-CM | POA: Insufficient documentation

## 2024-09-19 DIAGNOSIS — Z7901 Long term (current) use of anticoagulants: Secondary | ICD-10-CM | POA: Diagnosis not present

## 2024-09-19 DIAGNOSIS — Z8669 Personal history of other diseases of the nervous system and sense organs: Secondary | ICD-10-CM | POA: Diagnosis not present

## 2024-09-19 DIAGNOSIS — Z79899 Other long term (current) drug therapy: Secondary | ICD-10-CM | POA: Diagnosis not present

## 2024-09-19 DIAGNOSIS — Z8673 Personal history of transient ischemic attack (TIA), and cerebral infarction without residual deficits: Secondary | ICD-10-CM | POA: Diagnosis not present

## 2024-09-19 DIAGNOSIS — F1090 Alcohol use, unspecified, uncomplicated: Secondary | ICD-10-CM | POA: Diagnosis not present

## 2024-09-19 DIAGNOSIS — R29818 Other symptoms and signs involving the nervous system: Secondary | ICD-10-CM | POA: Diagnosis not present

## 2024-09-19 DIAGNOSIS — R918 Other nonspecific abnormal finding of lung field: Secondary | ICD-10-CM | POA: Diagnosis not present

## 2024-09-19 LAB — I-STAT CHEM 8, ED
BUN: 20 mg/dL (ref 8–23)
Calcium, Ion: 1.18 mmol/L (ref 1.15–1.40)
Chloride: 98 mmol/L (ref 98–111)
Creatinine, Ser: 0.6 mg/dL (ref 0.44–1.00)
Glucose, Bld: 109 mg/dL — ABNORMAL HIGH (ref 70–99)
HCT: 44 % (ref 36.0–46.0)
Hemoglobin: 15 g/dL (ref 12.0–15.0)
Potassium: 3.8 mmol/L (ref 3.5–5.1)
Sodium: 136 mmol/L (ref 135–145)
TCO2: 24 mmol/L (ref 22–32)

## 2024-09-19 LAB — ETHANOL: Alcohol, Ethyl (B): <15 mg/dL (ref <15)

## 2024-09-19 LAB — COMPREHENSIVE METABOLIC PANEL WITH GFR
ALT: 26 U/L (ref 0–44)
AST: 60 U/L — ABNORMAL HIGH (ref 15–41)
Albumin: 4.4 g/dL (ref 3.5–5.0)
Alkaline Phosphatase: 71 U/L (ref 38–126)
Anion gap: 12 (ref 5–15)
BUN: 18 mg/dL (ref 8–23)
CO2: 25 mmol/L (ref 22–32)
Calcium: 9.9 mg/dL (ref 8.9–10.3)
Chloride: 98 mmol/L (ref 98–111)
Creatinine, Ser: 0.57 mg/dL (ref 0.44–1.00)
GFR, Estimated: >60 mL/min (ref >60)
Glucose, Bld: 110 mg/dL — ABNORMAL HIGH (ref 70–99)
Potassium: 3.9 mmol/L (ref 3.5–5.1)
Sodium: 135 mmol/L (ref 135–145)
Total Bilirubin: 0.7 mg/dL (ref 0.0–1.2)
Total Protein: 7.3 g/dL (ref 6.5–8.1)

## 2024-09-19 LAB — DIFFERENTIAL
Abs Immature Granulocytes: 0.02 K/uL (ref 0.00–0.07)
Basophils Absolute: 0.1 K/uL (ref 0.0–0.1)
Basophils Relative: 1 %
Eosinophils Absolute: 0.1 K/uL (ref 0.0–0.5)
Eosinophils Relative: 2 %
Immature Granulocytes: 0 %
Lymphocytes Relative: 22 %
Lymphs Abs: 1.5 K/uL (ref 0.7–4.0)
Monocytes Absolute: 0.8 K/uL (ref 0.1–1.0)
Monocytes Relative: 11 %
Neutro Abs: 4.4 K/uL (ref 1.7–7.7)
Neutrophils Relative %: 64 %

## 2024-09-19 LAB — CBG MONITORING, ED: Glucose-Capillary: 107 mg/dL — ABNORMAL HIGH (ref 70–99)

## 2024-09-19 LAB — CBC
HCT: 42.6 % (ref 36.0–46.0)
Hemoglobin: 14.2 g/dL (ref 12.0–15.0)
MCH: 30.1 pg (ref 26.0–34.0)
MCHC: 33.3 g/dL (ref 30.0–36.0)
MCV: 90.3 fL (ref 80.0–100.0)
Platelets: 228 K/uL (ref 150–400)
RBC: 4.72 MIL/uL (ref 3.87–5.11)
RDW: 13.4 % (ref 11.5–15.5)
WBC: 6.9 K/uL (ref 4.0–10.5)
nRBC: 0 % (ref 0.0–0.2)

## 2024-09-19 LAB — APTT: aPTT: 24 s (ref 24–36)

## 2024-09-19 LAB — PROTIME-INR
INR: 0.9 (ref 0.8–1.2)
Prothrombin Time: 12.5 s (ref 11.4–15.2)

## 2024-09-19 MED ORDER — LEVOTHYROXINE SODIUM 75 MCG PO TABS
75.0000 ug | ORAL_TABLET | ORAL | Status: DC
  Administered 2024-09-21: 75 ug via ORAL
  Filled 2024-09-19: qty 1

## 2024-09-19 MED ORDER — LEVOTHYROXINE SODIUM 75 MCG PO TABS
112.5000 ug | ORAL_TABLET | ORAL | Status: DC
  Administered 2024-09-20: 112.5 ug via ORAL
  Filled 2024-09-19: qty 1

## 2024-09-19 MED ORDER — POLYVINYL ALCOHOL 1.4 % OP SOLN: 1.0000 [drp] | Freq: Three times a day (TID) | OPHTHALMIC | Status: DC | PRN

## 2024-09-19 MED ORDER — ATORVASTATIN CALCIUM 40 MG PO TABS
40.0000 mg | ORAL_TABLET | Freq: Every day | ORAL | Status: DC
  Administered 2024-09-19 – 2024-09-20 (×2): 40 mg via ORAL
  Filled 2024-09-19 (×2): qty 1

## 2024-09-19 MED ORDER — ACETAMINOPHEN 325 MG PO TABS: 650.0000 mg | ORAL_TABLET | Freq: Four times a day (QID) | ORAL | Status: DC | PRN

## 2024-09-19 MED ORDER — CLOPIDOGREL BISULFATE 75 MG PO TABS
75.0000 mg | ORAL_TABLET | Freq: Every day | ORAL | Status: DC
  Administered 2024-09-20: 75 mg via ORAL
  Filled 2024-09-19 (×2): qty 1

## 2024-09-19 MED ORDER — LEVOTHYROXINE SODIUM 25 MCG PO TABS: 75.0000 ug | ORAL_TABLET | ORAL | Status: DC

## 2024-09-19 MED ORDER — SODIUM CHLORIDE 0.9% FLUSH: 3.0000 mL | Freq: Once | INTRAVENOUS | Status: DC

## 2024-09-19 MED ORDER — LEVOTHYROXINE SODIUM 50 MCG PO TABS: 75.0000 ug | ORAL_TABLET | Freq: Every day | ORAL | Status: DC

## 2024-09-19 MED ORDER — CLOPIDOGREL BISULFATE 75 MG PO TABS
75.0000 mg | ORAL_TABLET | Freq: Once | ORAL | Status: AC
  Administered 2024-09-19: 75 mg via ORAL
  Filled 2024-09-19: qty 1

## 2024-09-19 MED ORDER — STROKE: EARLY STAGES OF RECOVERY BOOK
Freq: Once | Status: AC
  Filled 2024-09-19 (×2): qty 1

## 2024-09-19 MED ORDER — IOHEXOL 350 MG/ML SOLN
75.0000 mL | Freq: Once | INTRAVENOUS | Status: AC | PRN
  Administered 2024-09-19: 75 mL via INTRAVENOUS

## 2024-09-19 MED ORDER — STROKE: EARLY STAGES OF RECOVERY BOOK: Freq: Once | Status: DC

## 2024-09-19 MED ORDER — ASPIRIN 81 MG PO TBEC
81.0000 mg | DELAYED_RELEASE_TABLET | Freq: Every day | ORAL | Status: DC
  Administered 2024-09-19 – 2024-09-21 (×4): 81 mg via ORAL
  Filled 2024-09-19 (×3): qty 1

## 2024-09-19 MED ORDER — CARBOXYMETHYLCELLULOSE SODIUM 1 % OP SOLN: 1.0000 [drp] | Freq: Three times a day (TID) | OPHTHALMIC | Status: DC | PRN

## 2024-09-19 NOTE — H&P (Signed)
 History and Physical    Debra Herman FMW:[id number] DOB: 11-30-1945 DOA: 09/19/2024  PCP: Aisha Harvey, MD Patient coming from: Home  Chief Complaint: visual disturbance and word difficulty   HPI: Debra Herman is a 78 y.o. female with medical history significant of TIA, left facial paralysis secondary to Bell's palsy,migraine headache .  The patient presented to the hospital this evening with complaint of temporary loss of vision and comprehension. She states that she was getting ready to read her book around 2:30-3:00 PM this afternoon and as she open the book she felt as though she was having a visual migraine and the words were floating. She states that this lasted about 20 minutes and then resolved. She subsequently went to read a presentation she was scheduled to give at 4:00 pm this evening at around 3:30 pm and realized that she was looking at the words but did not know what they meant. She tried slowly reading each words aloud and said they did not match what she was thinking. This also lasted for about 20-30 minutes and she became very concerned. As a result, she presented to the hospital for further workup and treatment.   ED Course:  In the ER, BP 136/67, HR 65, RR 18, O2 saturation 96% on RA,  and Tmax 98. Cbc demonstrated wbc,  hb/hct, and platelet. Chemistry demonstrated Na 136, K 3.8, Cl 98, bicarb 25, Bun/Cr 20/0.60 and glucose 109.  CTA head and neck demonstrated no large vessel occlusions, hemodynamically significant stenosis, or aneurysm in the head or neck.  Hazy biapical pulmonary opacities which may reflect mild pulmonary edema.  CT brain was negative for any acute hemorrhage or evidence of acute infarct.  Review of Systems:  All systems reviewed and apart from history of presenting illness, are negative.  Past Medical History:  Diagnosis Date   Coronary atherosclerosis due to calcified coronary lesion    Hyperlipidemia    Hypertension    TIA (transient ischemic  attack)     Past Surgical History:  Procedure Laterality Date   ABDOMINAL SURGERY     BREAST CYST ASPIRATION Right    ESOPHAGOGASTRODUODENOSCOPY (EGD) WITH PROPOFOL  Bilateral 03/01/2023   Procedure: ESOPHAGOGASTRODUODENOSCOPY (EGD) WITH PROPOFOL ;  Surgeon: Burnette Fallow, MD;  Location: [facility];  Service: Gastroenterology;  Laterality: Bilateral;   FINE NEEDLE ASPIRATION N/A 03/01/2023   Procedure: FINE NEEDLE ASPIRATION (FNA) LINEAR;  Surgeon: Burnette Fallow, MD;  Location: [facility];  Service: Gastroenterology;  Laterality: N/A;   LAPAROTOMY N/A 11/07/2022   Procedure: EXPLORATORY LAPAROTOMY LYSIS OF ADHESIONS;  Surgeon: Ebbie Cough, MD;  Location: [facility];  Service: General;  Laterality: N/A;   UPPER ESOPHAGEAL ENDOSCOPIC ULTRASOUND (EUS) Bilateral 03/01/2023   Procedure: UPPER ESOPHAGEAL ENDOSCOPIC ULTRASOUND (EUS);  Surgeon: Burnette Fallow, MD;  Location: [facility];  Service: Gastroenterology;  Laterality: Bilateral;     reports that she quit smoking about 44 years ago. Her smoking use included cigarettes. She has never used smokeless tobacco. She reports current alcohol use of about 2.0 standard drinks of alcohol per week. She reports that she does not currently use drugs after having used the following drugs: Marijuana and LSD.  Allergies  Allergen Reactions   Sulfa Antibiotics Itching   Gadolinium Derivatives Itching and Rash    Pt had MRI abdomen with contrast on 09/04/23. Pt called 2 weeks later (09/18/23) stating she experienced a delayed reaction later on the day of 09/04/23 with symptoms of itching/rash. She stated she was still experiencing some itching 2  weeks later.    Latex Hives and Rash    Family History  Problem Relation Age of Onset   Lung cancer Mother    Heart disease Father    Alcoholism Father    Liver disease Father    Multiple sclerosis Sister    Heart attack Brother    Breast cancer Neg Hx     Prior to Admission medications    Medication Sig Start Date End Date Taking? Authorizing Provider  acetaminophen  (TYLENOL ) 500 MG tablet Take 2 tablets (1,000 mg total) by mouth every 6 (six) hours as needed. 11/10/22   Tammy Sor, PA-C  atorvastatin (LIPITOR) 40 MG tablet Take 40 mg by mouth every evening.    [provider]  bisoprolol -hydrochlorothiazide  (ZIAC ) 5-6.25 MG tablet Take 1 tablet by mouth daily.    [provider]  cholecalciferol (VITAMIN D3) 25 MCG (1000 UNIT) tablet Take 1,000 Units by mouth daily.    [provider]  clopidogrel  (PLAVIX ) 75 MG tablet Take 1 tablet (75 mg total) by mouth every evening. 03/03/23   Burnette Fallow, MD  ESTRACE VAGINAL 0.1 MG/GM vaginal cream Place 1 Applicatorful vaginally 2 (two) times a week.    [provider]  levothyroxine  (SYNTHROID ) 75 MCG tablet Take 75-112.5 mcg by mouth See admin instructions. Takes 75mcg daily except on Fridays takes 112.5mcg. 04/16/13   [provider]  Multiple Vitamins-Minerals (ICAPS AREDS 2 PO) Take 1 tablet by mouth in the morning and at bedtime.    [provider]    Physical Exam: Vitals:   09/19/24 1756 09/19/24 1801 09/19/24 2012  BP: (!) 184/68  136/67  Pulse: 78  65  Resp: 19  18  Temp: 98 F (36.7 C)    SpO2: 100%  96%  Weight:  56.7 kg   Height:  5' 1 (1.549 m)     Physical Exam Constitutional:      General: He is not in acute distress.    Appearance: Normal appearance.  HENT:     Head: Normocephalic and atraumatic.  Eyes:     Extraocular Movements: Extraocular movements intact.     Conjunctiva/sclera: Conjunctivae normal.     Pupils: Pupils are equal, round, and reactive to light.  Cardiovascular:     Rate and Rhythm: Normal rate and regular rhythm.     Pulses: Normal pulses.     Heart sounds: Normal heart sounds.  Pulmonary:     Effort: Pulmonary effort is normal. No respiratory distress.     Breath sounds: Normal breath sounds. No wheezing, rhonchi or rales.   Abdominal:     General: Abdomen is flat. Bowel sounds are normal. There is no distension.     Palpations: Abdomen is soft.     Tenderness: There is no abdominal tenderness.  Musculoskeletal:        General: No deformity. Normal range of motion.  Skin:    General: Skin is warm and dry.     Coloration: Skin is not jaundiced.  Neurological:     General: No focal deficit present.     Mental Status: He is alert and oriented to person, place, and time. Mental status is at baseline.   Labs on Admission: I have personally reviewed following labs and imaging studies  CBC: Recent Labs  Lab 09/19/24 1805 09/19/24 1814  WBC 6.9  --   NEUTROABS 4.4  --   HGB 14.2 15.0  HCT 42.6 44.0  MCV 90.3  --   PLT 228  --  Basic Metabolic Panel: Recent Labs  Lab 09/19/24 1805 09/19/24 1814  NA 135 136  K 3.9 3.8  CL 98 98  CO2 25  --   GLUCOSE 110* 109*  BUN 18 20  CREATININE 0.57 0.60  CALCIUM 9.9  --    GFR: Estimated Creatinine Clearance: 45.1 mL/min (by C-G formula based on SCr of 0.6 mg/dL). Liver Function Tests: Recent Labs  Lab 09/19/24 1805  AST 60*  ALT 26  ALKPHOS 71  BILITOT 0.7  PROT 7.3  ALBUMIN 4.4   No results for input(s): LIPASE, AMYLASE in the last 168 hours. No results for input(s): AMMONIA in the last 168 hours. Coagulation Profile: Recent Labs  Lab 09/19/24 1805  INR 0.9   Cardiac Enzymes: No results for input(s): CKTOTAL, CKMB, CKMBINDEX, TROPONINI in the last 168 hours. BNP (last 3 results) No results for input(s): PROBNP in the last 8760 hours. HbA1C: No results for input(s): HGBA1C in the last 72 hours. CBG: Recent Labs  Lab 09/19/24 1755  GLUCAP 107*   Lipid Profile: No results for input(s): CHOL, HDL, LDLCALC, TRIG, CHOLHDL, LDLDIRECT in the last 72 hours. Thyroid  Function Tests: No results for input(s): TSH, T4TOTAL, FREET4, T3FREE, THYROIDAB in the last 72 hours. Anemia Panel: No results for  input(s): VITAMINB12, FOLATE, FERRITIN, TIBC, IRON, RETICCTPCT in the last 72 hours. Urine analysis:    Component Value Date/Time   COLORURINE YELLOW 11/06/2022 2357   APPEARANCEUR CLEAR 11/06/2022 2357   LABSPEC 1.015 11/06/2022 2357   PHURINE 7.0 11/06/2022 2357   GLUCOSEU NEGATIVE 11/06/2022 2357   HGBUR NEGATIVE 11/06/2022 2357   BILIRUBINUR NEGATIVE 11/06/2022 2357   KETONESUR 5 (A) 11/06/2022 2357   PROTEINUR NEGATIVE 11/06/2022 2357   UROBILINOGEN 1.0 09/30/2009 0905   NITRITE NEGATIVE 11/06/2022 2357   LEUKOCYTESUR TRACE (A) 11/06/2022 2357    Radiological Exams on Admission: No results found.  EKG: Independently reviewed.   Assessment/Plan Principal Problem:   TIA (transient ischemic attack) Active Problems:   H/O Bell's palsy   Essential hypertension   Hypothyroidism   History of TIAs   HLD (hyperlipidemia)   TIA Allow for permissive hypertension Will obtain MRI brain without contrast  Will obtain echocardiogram CTA head and neck demonstrated no large vessel occlusions, hemodynamically significant stenosis or aneurysm.  Hazy biapical pulmonary opacities which may reflect Will give aspirin and statin therapy Patient already on plavix  75 daily at home and will continue  Will consult physical and occupational therapy Will consult speech therapy Neurology will consulted to see the patient  H/o Bells palsy This is chronic in nature  Essential hypertension Will  hold blood pressure medications  Hypothyroidism Will continue levothyroxine    Migraine headache Patient states that she has had visual migraine before It has been years since the last one   HLD Will restart on lipitor 40 mg daily    DVT prophylaxis:   Code Status:  Family Communication:   Disposition Plan:  Patient class is not currently inpatient or observation. Update patient class prior to completing documentation    Consults called:  neurology Admission status:  inpatient Level of care: Level of care: progressive The medical decision making on this patient was of high complexity and the patient is at high risk for clinical deterioration, therefore this is a level 3 visit.  The medical decision making is of moderate complexity, therefore this is a level 2 visit.  Bradly MARLA Drones MD [facility]  If 7PM-7AM, please contact night-coverage [email or web address]  09/19/2024, 8:44  PM

## 2024-09-19 NOTE — ED Provider Notes (Signed)
 [place] EMERGENCY DEPARTMENT AT [facility] Provider Note   CSN: [id number] Arrival date & time: 09/19/24  1751     Patient presents with: Aphasia   Debra Herman is a 78 y.o. female.   The history is provided by the patient and medical records. No language interpreter was used.     78 year old female with history of TIA currently on Plavix , hypertension, CAD, hyperlipidemia presenting to the ED with complaints of speech difficulty.  Patient report approximately 3 hours ago she noticed some spots in her right vision and blurry vision.  She then noticed that she was having difficulty forming her speech.  States she wants to say certain words but having difficulty with phonation.  She did endorse some mild headache and dizziness with this episode.  Symptoms did  subsequently improved.  However patient states she just felt a bit off at this time.  She does not endorse any focal numbness or focal weakness to her arms or legs.  She does have history of Bell's palsy affecting her left side of face but denies any worsening symptoms from that standpoint.  Denies double vision or loss of vision.  No fevers or chills.  Prior to Admission medications   Medication Sig Start Date End Date Taking? Authorizing Provider  acetaminophen  (TYLENOL ) 500 MG tablet Take 2 tablets (1,000 mg total) by mouth every 6 (six) hours as needed. 11/10/22   Tammy Sor, PA-C  atorvastatin  (LIPITOR) 40 MG tablet Take 40 mg by mouth every evening.    [provider]  bisoprolol -hydrochlorothiazide  (ZIAC ) 5-6.25 MG tablet Take 1 tablet by mouth daily.    [provider]  cholecalciferol (VITAMIN D3) 25 MCG (1000 UNIT) tablet Take 1,000 Units by mouth daily.    [provider]  clopidogrel  (PLAVIX ) 75 MG tablet Take 1 tablet (75 mg total) by mouth every evening. 03/03/23   Burnette Fallow, MD  ESTRACE VAGINAL 0.1 MG/GM vaginal cream Place 1 Applicatorful vaginally 2 (two) times a  week.    [provider]  levothyroxine  (SYNTHROID ) 75 MCG tablet Take 75-112.5 mcg by mouth See admin instructions. Takes 75mcg daily except on Fridays takes 112.5mcg. 04/16/13   [provider]  Multiple Vitamins-Minerals (ICAPS AREDS 2 PO) Take 1 tablet by mouth in the morning and at bedtime.    [provider]    Allergies: Sulfa antibiotics, Gadolinium derivatives, and Latex    Review of Systems  All other systems reviewed and are negative.   Updated Vital Signs BP (!) 184/68   Pulse 78   Temp 98 F (36.7 C)   Resp 19   Ht 5' 1 (1.549 m)   Wt 56.7 kg   LMP  (LMP Unknown)   SpO2 100%   BMI 23.62 kg/m   Physical Exam Vitals and nursing note reviewed.  Constitutional:      General: She is not in acute distress.    Appearance: She is well-developed.  HENT:     Head: Atraumatic.  Eyes:     Extraocular Movements: Extraocular movements intact.     Conjunctiva/sclera: Conjunctivae normal.     Pupils: Pupils are equal, round, and reactive to light.  Cardiovascular:     Rate and Rhythm: Normal rate and regular rhythm.     Pulses: Normal pulses.     Heart sounds: Normal heart sounds.  Pulmonary:     Effort: Pulmonary effort is normal.     Breath sounds: Normal breath sounds. No wheezing, rhonchi or  rales.  Abdominal:     Palpations: Abdomen is soft.     Tenderness: There is no abdominal tenderness.  Musculoskeletal:     Cervical back: Normal range of motion and neck supple.  Skin:    Findings: No rash.  Neurological:     Mental Status: She is alert and oriented to person, place, and time.     Comments: Neurologic exam:  A&O x 4 Speech clear, pupils equal round reactive to light, extraocular movements intact  Normal peripheral visual fields L side facial droop 2/2 Bells Palsy Follows commands, moves all extremities x4, normal strength to bilateral upper and lower extremities at all major muscle groups including grip Sensation normal to  light touch  Coordination intact, no limb ataxia, finger-nose-finger normal No pronator drift Gait normal   Psychiatric:        Mood and Affect: Mood normal.     (all labs ordered are listed, but only abnormal results are displayed) Labs Reviewed  COMPREHENSIVE METABOLIC PANEL WITH GFR - Abnormal; Notable for the following components:      Result Value   Glucose, Bld 110 (*)    AST 60 (*)    All other components within normal limits  CBG MONITORING, ED - Abnormal; Notable for the following components:   Glucose-Capillary 107 (*)    All other components within normal limits  I-STAT CHEM 8, ED - Abnormal; Notable for the following components:   Glucose, Bld 109 (*)    All other components within normal limits  PROTIME-INR  APTT  CBC  DIFFERENTIAL  ETHANOL  LIPID PANEL  HEMOGLOBIN A1C  CBG MONITORING, ED    EKG: None  Radiology: CT ANGIO HEAD NECK W WO CM Result Date: 09/19/2024 EXAM: CTA HEAD AND NECK WITH CONTRAST 09/19/2024 08:34:01 PM TECHNIQUE: CTA of the head and neck was performed with the administration of intravenous contrast. A non-contrast CT of the head was performed prior to the administration of intravenous contrast. Multiplanar 2D and/or 3D reformatted images are provided for review. Automated exposure control, iterative reconstruction, and/or weight based adjustment of the mA/kV was utilized to reduce the radiation dose to as low as reasonably achievable. Stenosis of the internal carotid arteries measured using NASCET criteria. CONTRAST: 75 mL (iohexol  (OMNIPAQUE ) 350 MG/ML injection 75 mL IOHEXOL  350 MG/ML SOLN) COMPARISON: None available CLINICAL HISTORY: Neuro deficit, acute, stroke suspected. FINDINGS: CTA NECK: AORTIC ARCH AND ARCH VESSELS: No dissection or arterial injury. No significant stenosis of the brachiocephalic or subclavian arteries. CERVICAL CAROTID ARTERIES: No dissection, arterial injury, or hemodynamically significant stenosis by NASCET criteria.  CERVICAL VERTEBRAL ARTERIES: No dissection, arterial injury, or significant stenosis. LUNGS AND MEDIASTINUM: Hazy biapical pulmonary opacities, possibly mild pulmonary edema. SOFT TISSUES: No acute abnormality. BONES: No acute abnormality. CTA HEAD: ANTERIOR CIRCULATION: No significant stenosis of the internal carotid arteries. No significant stenosis of the anterior cerebral arteries. No significant stenosis of the middle cerebral arteries. No aneurysm. POSTERIOR CIRCULATION: No significant stenosis of the posterior cerebral arteries. No significant stenosis of the basilar artery. No significant stenosis of the vertebral arteries. No aneurysm. OTHER: No dural venous sinus thrombosis on this non-dedicated study. CT BRAIN: BRAIN AND VENTRICLES: No acute hemorrhage. No evidence of acute infarct. No hydrocephalus. No extra-axial collection. No mass effect or midline shift. ORBITS: No acute abnormality. SINUSES: No acute abnormality. SOFT TISSUES AND SKULL: No acute soft tissue abnormality. No skull fracture. IMPRESSION: 1. No large vessel occlusion, hemodynamically significant stenosis, or aneurysm in the head or neck.  2. Hazy biapical pulmonary opacities, which may reflect mild pulmonary edema. Electronically signed by: Franky Stanford MD 09/19/2024 09:10 PM EST RP Workstation: [id number]     .Critical Care  Performed by: Nivia Colon, PA-C Authorized by: Nivia Colon, PA-C   Critical care provider statement:    Critical care time (minutes):  30   Critical care was time spent personally by me on the following activities:  Development of treatment plan with patient or surrogate, discussions with consultants, evaluation of patient's response to treatment, examination of patient, ordering and review of laboratory studies, ordering and review of radiographic studies, ordering and performing treatments and interventions, pulse oximetry, re-evaluation of patient's condition and review of old charts    Medications  Ordered in the ED   stroke: early stages of recovery book (has no administration in time range)  sodium chloride  flush (NS) 0.9 % injection 3 mL (has no administration in time range)                                    Medical Decision Making Amount and/or Complexity of Data Reviewed Labs: ordered. Radiology: ordered.  Risk OTC drugs. Prescription drug management. Decision regarding hospitalization.   BP (!) 184/68   Pulse 78   Temp 98 F (36.7 C)   Resp 19   Ht 5' 1 (1.549 m)   Wt 56.7 kg   LMP  (LMP Unknown)   SpO2 100%   BMI 23.62 kg/m   60:64 PM  78 year old female with history of TIA currently on Plavix , hypertension, CAD, hyperlipidemia presenting to the ED with complaints of speech difficulty.  Patient report approximately 3 hours ago she noticed some spots in her right vision and blurry vision.  She then noticed that she was having difficulty forming her speech.  States she wants to say certain words but having difficulty with phonation.  She did endorse some mild headache and dizziness with this episode.  Symptoms did  subsequently improved.  However patient states she just felt a bit off at this time.  She does not endorse any focal numbness or focal weakness to her arms or legs.  She does have history of Bell's palsy affecting her left side of face but denies any worsening symptoms from that standpoint.  Denies double vision or loss of vision.  No fevers or chills.  On exam patient does have a mild left-sided facial droop but this is secondary to her prior Bell's palsy.  Her speech is clear and fluent and goal oriented.  She does not have any focal numbness or focal weakness, she has normal gait, and she is mentating appropriately.  Patient symptoms concerning for TIA.  Code stroke was not activated as there are no residual objective deficit appreciated.  -Labs ordered, independently viewed and interpreted by me.  Labs remarkable for reassuring labs -The patient was  maintained on a cardiac monitor.  I personally viewed and interpreted the cardiac monitored which showed an underlying rhythm of: SR -Imaging independently viewed and interpreted by me and I agree with radiologist's interpretation.  Result remarkable for head/neck CTA without acute changes -This patient presents to the ED for concern of neuro complaint, this involves an extensive number of treatment options, and is a complaint that carries with it a high risk of complications and morbidity.  The differential diagnosis includes TIA, stroke, ocular migraine -Co morbidities that complicate the patient evaluation includes TIA -Treatment  includes plavix  -Reevaluation of the patient after these medicines showed that the patient improved -PCP office notes or outside notes reviewed -Discussion with specialist neurologist Dr. Lindzen who request pt to be admitted to [facility] for TIA work up.  I appreciate consultation from [facility] Dr. Lorren who agrees to admit pt -Escalation to admission/observation considered: patient agrees with admission      Final diagnoses:  TIA (transient ischemic attack)    ED Discharge Orders     None          Nivia Colon, PA-C 09/19/24 2348    Lenor Hollering, MD 09/20/24 [phone]

## 2024-09-19 NOTE — ED Triage Notes (Addendum)
 Patient said she was last normal at 3:30pm today. She has right eye blurred vision. Said she lost control of her words. Feels dizzy, has a headache. TIA was 2 years ago. Has bells palsy. Lonni PA in triage.

## 2024-09-20 ENCOUNTER — Inpatient Hospital Stay (HOSPITAL_COMMUNITY)

## 2024-09-20 DIAGNOSIS — R4701 Aphasia: Secondary | ICD-10-CM | POA: Diagnosis not present

## 2024-09-20 DIAGNOSIS — Z7902 Long term (current) use of antithrombotics/antiplatelets: Secondary | ICD-10-CM | POA: Diagnosis not present

## 2024-09-20 DIAGNOSIS — I959 Hypotension, unspecified: Secondary | ICD-10-CM | POA: Diagnosis not present

## 2024-09-20 DIAGNOSIS — Z743 Need for continuous supervision: Secondary | ICD-10-CM | POA: Diagnosis not present

## 2024-09-20 DIAGNOSIS — G459 Transient cerebral ischemic attack, unspecified: Secondary | ICD-10-CM | POA: Diagnosis not present

## 2024-09-20 LAB — ECHOCARDIOGRAM COMPLETE
AV Vena cont: 0.3 cm
Area-P 1/2: 3.46 cm2
Height: 61 in
P 1/2 time: 714 ms
S' Lateral: 2.5 cm
Single Plane A4C EF: 56.3 %
Weight: 2000.01 [oz_av]

## 2024-09-20 LAB — LIPID PANEL
Cholesterol: 100 mg/dL (ref 0–200)
HDL: 48 mg/dL (ref >40)
LDL Cholesterol: 41 mg/dL (ref 0–99)
Total CHOL/HDL Ratio: 2.1 ratio
Triglycerides: 54 mg/dL (ref <150)
VLDL: 11 mg/dL (ref 0–40)

## 2024-09-20 LAB — HEMOGLOBIN A1C
Hgb A1c MFr Bld: 5.4 % (ref 4.8–5.6)
Mean Plasma Glucose: 108 mg/dL

## 2024-09-20 MED ORDER — ASPIRIN 81 MG PO TBEC: 81.0000 mg | DELAYED_RELEASE_TABLET | Freq: Every day | ORAL | 0 refills | Status: DC

## 2024-09-20 NOTE — Plan of Care (Signed)
  Problem: Education: Goal: Knowledge of disease or condition will improve Outcome: Progressing Goal: Knowledge of secondary prevention will improve (MUST DOCUMENT ALL) Outcome: Progressing Goal: Knowledge of patient specific risk factors will improve (DELETE if not current risk factor) Outcome: Progressing   Problem: Ischemic Stroke/TIA Tissue Perfusion: Goal: Complications of ischemic stroke/TIA will be minimized Outcome: Progressing   Problem: Coping: Goal: Will verbalize positive feelings about self Outcome: Progressing Goal: Will identify appropriate support needs Outcome: Progressing   Problem: Self-Care: Goal: Ability to participate in self-care as condition permits will improve Outcome: Progressing Goal: Verbalization of feelings and concerns over difficulty with self-care will improve Outcome: Progressing Goal: Ability to communicate needs accurately will improve Outcome: Progressing   Problem: Health Behavior/Discharge Planning: Goal: Ability to manage health-related needs will improve Outcome: Progressing   Problem: Education: Goal: Knowledge of General Education information will improve Description: Including pain rating scale, medication(s)/side effects and non-pharmacologic comfort measures Outcome: Progressing   Problem: Clinical Measurements: Goal: Ability to maintain clinical measurements within normal limits will improve Outcome: Progressing Goal: Will remain free from infection Outcome: Progressing Goal: Diagnostic test results will improve Outcome: Progressing Goal: Respiratory complications will improve Outcome: Progressing Goal: Cardiovascular complication will be avoided Outcome: Progressing   Problem: Activity: Goal: Risk for activity intolerance will decrease Outcome: Progressing

## 2024-09-20 NOTE — Discharge Instructions (Addendum)
 Follow with Primary MD Aisha Harvey, MD in 7 days   Get CBC, CMP, Magnesium  -  checked next visit with your primary MD    Activity: As tolerated with Full fall precautions use walker/cane & assistance as needed  Disposition Home    Diet: Heart Healthy low carbohydrate  Special Instructions: If you have smoked or chewed Tobacco  in the last 2 yrs please stop smoking, stop any regular Alcohol   and or any Recreational drug use.  On your next visit with your primary care physician please Get Medicines reviewed and adjusted.  Please request your Prim.MD to go over all Hospital Tests and Procedure/Radiological results at the follow up, please get all Hospital records sent to your Prim MD by signing hospital release before you go home.  If you experience worsening of your admission symptoms, develop shortness of breath, life threatening emergency, suicidal or homicidal thoughts you must seek medical attention immediately by calling 911 or calling your MD immediately  if symptoms less severe.  You Must read complete instructions/literature along with all the possible adverse reactions/side effects for all the Medicines you take and that have been prescribed to you. Take any new Medicines after you have completely understood and accpet all the possible adverse reactions/side effects.   Do not drive when taking Pain medications.  Do not take more than prescribed Pain, Sleep and Anxiety Medications  Wear Seat belts while driving.

## 2024-09-20 NOTE — Progress Notes (Signed)
  Echocardiogram 2D Echocardiogram has been performed.  Koleen KANDICE Popper, RDCS 09/20/2024, 1:51 PM

## 2024-09-20 NOTE — Progress Notes (Addendum)
 PT Cancellation Note  Patient Details Name: Debra Herman MRN: [id number] DOB: [birth year]   Cancelled Treatment:    Reason Eval/Treat Not Completed: PT screened, no needs identified, will sign off PT orders received. Per OT, pt is at baseline level of function at this time. PT to complete current orders. Please re-consult if new needs arise.  Debra Herman, PT, DPT 09/20/24, 9:08 AM   Debra Herman 09/20/2024, 9:07 AM

## 2024-09-20 NOTE — Discharge Summary (Incomplete)
 Physician Discharge Summary  Debra Herman FMW:[id number] DOB: 1946-07-23 DOA: 09/19/2024  PCP: Aisha Harvey, MD  Admit date: 09/19/2024 Discharge date: 09/20/2024 Discharging to: *** Recommendations for Outpatient Follow-up:  ***  Consults:  *** Procedures:  ***   Discharge Diagnoses:   Principal Problem:   TIA (transient ischemic attack) Active Problems:   H/O Bell's palsy   Essential hypertension   Hypothyroidism   History of TIAs   HLD (hyperlipidemia)     Hospital Course:  ***  Principal Problem:   TIA (transient ischemic attack) Active Problems:   H/O Bell's palsy   Essential hypertension   Hypothyroidism   History of TIAs   HLD (hyperlipidemia)    Body mass index is 23.62 kg/m. Nutrition Status:          Discharge Instructions   Allergies as of 09/20/2024       Reactions   Latex Hives, Swelling, Dermatitis, Rash   Tape Hives, Itching, Swelling, Dermatitis, Rash, Other (See Comments)   No bandaging that contains latex!!   Gadolinium Derivatives Itching, Rash, Other (See Comments)   Pt had MRI abdomen with contrast on 09/04/23. Pt called 2 weeks later (09/18/23) stating she experienced a delayed reaction later on the day of 09/04/23 with symptoms of itching/rash. She stated she was still experiencing some itching 2 weeks later.    Gadopiclenol  Itching, Nausea Only, Rash   Iodinated Contrast Media Dermatitis, Rash   Sulfa Antibiotics Itching, Rash, Other (See Comments)   Rash - as a child        Medication List     TAKE these medications    acetaminophen  500 MG tablet Commonly known as: TYLENOL  Take 2 tablets (1,000 mg total) by mouth every 6 (six) hours as needed. What changed: reasons to take this   Artificial Tears 1 % ophthalmic solution Generic drug: carboxymethylcellulose Place 1 drop into both eyes 3 (three) times daily as needed (for dryness).   aspirin  EC 81 MG tablet Take 1 tablet (81 mg total) by mouth daily for 21  days. Swallow whole. Start taking on: September 21, 2024   atorvastatin  40 MG tablet Commonly known as: LIPITOR Take 40 mg by mouth at bedtime.   bisoprolol -hydrochlorothiazide  5-6.25 MG tablet Commonly known as: ZIAC  Take 1 tablet by mouth daily.   cholecalciferol 25 MCG (1000 UNIT) tablet Commonly known as: VITAMIN D3 Take 1,000 Units by mouth daily.   clopidogrel  75 MG tablet Commonly known as: PLAVIX  Take 1 tablet (75 mg total) by mouth every evening.   ESTRACE VAGINAL 0.1 MG/GM Crea vaginal cream Generic drug: estradiol Place 1 Applicatorful vaginally 2 (two) times a week.   ICAPS AREDS 2 PO Take 1 capsule by mouth 2 (two) times daily with a meal.   levothyroxine  75 MCG tablet Commonly known as: SYNTHROID  Take 75-112.5 mcg by mouth See admin instructions. Take 75 mcg by mouth 30 minutes before breakfast on Sun/Mon/Tues/Wed/Thurs/Sat and 112.5 mcg on Fridays   metFORMIN 500 MG 24 hr tablet Commonly known as: GLUCOPHAGE-XR Take 500 mg by mouth See admin instructions. Take 500 mg by mouth with breakfast and supper/evening meal            The results of significant diagnostics from this hospitalization (including imaging, microbiology, ancillary and laboratory) are listed below for reference.    ECHOCARDIOGRAM COMPLETE Result Date: 09/20/2024    ECHOCARDIOGRAM REPORT   Patient Name:   Debra Herman Date of Exam: 09/20/2024 Medical Rec #:  [id number]  Height:       61.0 in Accession #:    [id number]       Weight:       125.0 lb Date of Birth:  1946/09/21        BSA:          1.547 m Patient Age:    78 years         BP:           139/68 mmHg Patient Gender: F                HR:           69 bpm. Exam Location:  Inpatient Procedure: 2D Echo, Cardiac Doppler and Color Doppler (Both Spectral and Color            Flow Doppler were utilized during procedure). Indications:    TIA G45.9  History:        Patient has prior history of Echocardiogram examinations, most                  recent 06/15/2023. TIA; Risk Factors:Hypertension and                 Dyslipidemia.  Sonographer:    Koleen Popper RDCS Referring Phys: [id number] BOWIE TRAN IMPRESSIONS  1. Left ventricular ejection fraction, by estimation, is 65 to 70%. The left ventricle has normal function. The left ventricle has no regional wall motion abnormalities. Left ventricular diastolic parameters are consistent with Grade I diastolic dysfunction (impaired relaxation).  2. Right ventricular systolic function is normal. The right ventricular size is normal. Tricuspid regurgitation signal is inadequate for assessing PA pressure.  3. The mitral valve is normal in structure. Mild mitral valve regurgitation. No evidence of mitral stenosis.  4. The aortic valve is tricuspid. Aortic valve regurgitation is mild. Aortic valve sclerosis is present, with no evidence of aortic valve stenosis.  5. The inferior vena cava is normal in size with greater than 50% respiratory variability, suggesting right atrial pressure of 3 mmHg. Comparison(s): No prior Echocardiogram. FINDINGS  Left Ventricle: Left ventricular ejection fraction, by estimation, is 65 to 70%. The left ventricle has normal function. The left ventricle has no regional wall motion abnormalities. The left ventricular internal cavity size was normal in size. There is  no left ventricular hypertrophy. Left ventricular diastolic parameters are consistent with Grade I diastolic dysfunction (impaired relaxation). Right Ventricle: The right ventricular size is normal. Right ventricular systolic function is normal. Tricuspid regurgitation signal is inadequate for assessing PA pressure. The tricuspid regurgitant velocity is 2.48 m/s, and with an assumed right atrial  pressure of 3 mmHg, the estimated right ventricular systolic pressure is 27.6 mmHg. Left Atrium: Left atrial size was normal in size. Right Atrium: Right atrial size was normal in size. Pericardium: There is no evidence of pericardial  effusion. Mitral Valve: The mitral valve is normal in structure. Mild mitral valve regurgitation. No evidence of mitral valve stenosis. Tricuspid Valve: The tricuspid valve is normal in structure. Tricuspid valve regurgitation is mild . No evidence of tricuspid stenosis. Aortic Valve: The aortic valve is tricuspid. Aortic valve regurgitation is mild. Aortic regurgitation PHT measures 714 msec. Aortic valve sclerosis is present, with no evidence of aortic valve stenosis. Pulmonic Valve: The pulmonic valve was normal in structure. Pulmonic valve regurgitation is trivial. No evidence of pulmonic stenosis. Aorta: The aortic root is normal in size and structure. Venous: The inferior vena cava is normal in size with greater  than 50% respiratory variability, suggesting right atrial pressure of 3 mmHg. IAS/Shunts: No atrial level shunt detected by color flow Doppler.  LEFT VENTRICLE PLAX 2D LVIDd:         4.40 cm     Diastology LVIDs:         2.50 cm     LV e' medial:    4.68 cm/s LV PW:         0.90 cm     LV E/e' medial:  13.9 LV IVS:        0.90 cm     LV e' lateral:   6.42 cm/s LVOT diam:     1.90 cm     LV E/e' lateral: 10.1 LV SV:         41 LV SV Index:   26 LVOT Area:     2.84 cm  LV Volumes (MOD) LV vol d, MOD A4C: 80.5 ml LV vol s, MOD A4C: 35.2 ml LV SV MOD A4C:     80.5 ml RIGHT VENTRICLE             IVC RV S prime:     11.30 cm/s  IVC diam: 1.70 cm TAPSE (M-mode): 2.3 cm LEFT ATRIUM             Index LA diam:        3.10 cm 2.00 cm/m LA Vol (A2C):   31.1 ml 20.11 ml/m LA Vol (A4C):   30.2 ml 19.52 ml/m LA Biplane Vol: 33.9 ml 21.92 ml/m  AORTIC VALVE LVOT Vmax:         70.30 cm/s LVOT Vmean:        45.400 cm/s LVOT VTI:          0.144 m AI PHT:            714 msec AR Vena Contracta: 0.30 cm  AORTA Ao Root diam: 3.00 cm MITRAL VALVE               TRICUSPID VALVE MV Area (PHT): 3.46 cm    TR Peak grad:   24.6 mmHg MV Decel Time: 219 msec    TR Mean grad:   16.0 mmHg MV E velocity: 65.10 cm/s  TR Vmax:         248.00 cm/s MV A velocity: 81.40 cm/s  TR Vmean:       194.0 cm/s MV E/A ratio:  0.80                            SHUNTS                            Systemic VTI:  0.14 m                            Systemic Diam: 1.90 cm Redell Shallow MD Electronically signed by Redell Shallow MD Signature Date/Time: 09/20/2024/2:13:27 PM    Final    MR BRAIN WO CONTRAST Result Date: 09/20/2024 EXAM: MRI BRAIN WITHOUT CONTRAST 09/20/2024 06:16:48 AM TECHNIQUE: Multiplanar multisequence MRI of the head/brain was performed without the administration of intravenous contrast. COMPARISON: CT head, CTA head and neck yesterday. Previous brain MRI 04/20/2020. CLINICAL HISTORY: 78 year old female. Transient ischemic attack (TIA). FINDINGS: BRAIN AND VENTRICLES: No acute infarct. No intracranial hemorrhage. No mass. No midline shift. No hydrocephalus. Brain volume is stable  and within normal limits for age. No cortical encephalomalacia or chronic cerebral blood products. Scattered periventricular and other bilateral cerebral white matter T2 and FLAIR hyperintensity is chronic and mildly progressed since 2021, mild to moderate for age now. Deep gray nuclei, brainstem, and cerebellum remain within normal limits. Normal flow voids. ORBITS: Normal suprasellar cistern and optic chiasm. Visible orbital soft tissues aside from interval postoperative changes to both globes. SINUSES AND MASTOIDS: Paranasal sinuses and mastoids remain well aerated. BONES AND SOFT TISSUES: Normal marrow signal and visible cervical spine. No acute soft tissue abnormality. IMPRESSION: 1. No acute intracranial abnormality. 2. Mildly progressed chronic cerebral white matter changes since 2021, most commonly due to small vessel disease. Electronically signed by: Helayne Hurst MD 09/20/2024 06:26 AM EST RP Workstation: [id number]   CT ANGIO HEAD NECK W WO CM Result Date: 09/19/2024 EXAM: CTA HEAD AND NECK WITH CONTRAST 09/19/2024 08:34:01 PM TECHNIQUE: CTA of the head and  neck was performed with the administration of intravenous contrast. A non-contrast CT of the head was performed prior to the administration of intravenous contrast. Multiplanar 2D and/or 3D reformatted images are provided for review. Automated exposure control, iterative reconstruction, and/or weight based adjustment of the mA/kV was utilized to reduce the radiation dose to as low as reasonably achievable. Stenosis of the internal carotid arteries measured using NASCET criteria. CONTRAST: 75 mL (iohexol  (OMNIPAQUE ) 350 MG/ML injection 75 mL IOHEXOL  350 MG/ML SOLN) COMPARISON: None available CLINICAL HISTORY: Neuro deficit, acute, stroke suspected. FINDINGS: CTA NECK: AORTIC ARCH AND ARCH VESSELS: No dissection or arterial injury. No significant stenosis of the brachiocephalic or subclavian arteries. CERVICAL CAROTID ARTERIES: No dissection, arterial injury, or hemodynamically significant stenosis by NASCET criteria. CERVICAL VERTEBRAL ARTERIES: No dissection, arterial injury, or significant stenosis. LUNGS AND MEDIASTINUM: Hazy biapical pulmonary opacities, possibly mild pulmonary edema. SOFT TISSUES: No acute abnormality. BONES: No acute abnormality. CTA HEAD: ANTERIOR CIRCULATION: No significant stenosis of the internal carotid arteries. No significant stenosis of the anterior cerebral arteries. No significant stenosis of the middle cerebral arteries. No aneurysm. POSTERIOR CIRCULATION: No significant stenosis of the posterior cerebral arteries. No significant stenosis of the basilar artery. No significant stenosis of the vertebral arteries. No aneurysm. OTHER: No dural venous sinus thrombosis on this non-dedicated study. CT BRAIN: BRAIN AND VENTRICLES: No acute hemorrhage. No evidence of acute infarct. No hydrocephalus. No extra-axial collection. No mass effect or midline shift. ORBITS: No acute abnormality. SINUSES: No acute abnormality. SOFT TISSUES AND SKULL: No acute soft tissue abnormality. No skull  fracture. IMPRESSION: 1. No large vessel occlusion, hemodynamically significant stenosis, or aneurysm in the head or neck. 2. Hazy biapical pulmonary opacities, which may reflect mild pulmonary edema. Electronically signed by: Franky Stanford MD 09/19/2024 09:10 PM EST RP Workstation: [id number]   Labs:   Basic Metabolic Panel: Recent Labs  Lab 09/19/24 1805 09/19/24 1814  NA 135 136  K 3.9 3.8  CL 98 98  CO2 25  --   GLUCOSE 110* 109*  BUN 18 20  CREATININE 0.57 0.60  CALCIUM  9.9  --      CBC: Recent Labs  Lab 09/19/24 1805 09/19/24 1814  WBC 6.9  --   NEUTROABS 4.4  --   HGB 14.2 15.0  HCT 42.6 44.0  MCV 90.3  --   PLT 228  --          SIGNED:   True Atlas, MD  [facility] 09/20/2024, 6:33 PM Time taking on discharge: 50 minutes

## 2024-09-20 NOTE — Care Management Obs Status (Signed)
 MEDICARE OBSERVATION STATUS NOTIFICATION   Patient Details  Name: Debra Herman MRN: [id number] Date of Birth: 05-28-1946   Medicare Observation Status Notification Given:  Yes    [name], RN 09/20/2024, 4:25 PM

## 2024-09-20 NOTE — Consult Note (Signed)
 NEUROLOGY CONSULT NOTE   Date of service: September 20, 2024 Patient Name: Debra Herman MRN:  [id number] DOB:  August 16, 1946 Chief Complaint: Transient episode of alexia Requesting Provider: Earley Saucer, MD  History of Present Illness  Debra Herman is a 78 y.o. female with hx of TIA, ocular migraines and Bell's palsy who presents with a transient episode of alexia.  Patient reports that first she had a typical ocular migraine with squiggly lines in her vision which prevented her from being able to read.  This lasted about 30 minutes and then resolved.  She then attempted to read her book again but was unable to decipher the text.  This episode lasted about another 30 minutes.  She reports having a similar episode about 3 or 4 years ago.  She states that she has had migraines since her teenage years and they have changed from painful migraines to migraines with only visual symptoms.  She does take Plavix  daily.  She reports no other recent symptoms.  LKW: 12/4 1400 Modified rankin score: 0-Completely asymptomatic and back to baseline post- stroke IV Thrombolysis: No, symptoms resolved EVT: No, symptoms resolved   NIHSS components Score: Comment  1a Level of Conscious 0[x]  1[]  2[]  3[]      1b LOC Questions 0[x]  1[]  2[]       1c LOC Commands 0[x]  1[]  2[]       2 Best Gaze 0[x]  1[]  2[]       3 Visual 0[x]  1[]  2[]  3[]      4 Facial Palsy 0[]  1[x]  2[]  3[]      5a Motor Arm - left 0[x]  1[]  2[]  3[]  4[]  UN[]    5b Motor Arm - Right 0[x]  1[]  2[]  3[]  4[]  UN[]    6a Motor Leg - Left 0[x]  1[]  2[]  3[]  4[]  UN[]    6b Motor Leg - Right 0[x]  1[]  2[]  3[]  4[]  UN[]    7 Limb Ataxia 0[x]  1[]  2[]  UN[]      8 Sensory 0[x]  1[]  2[]  UN[]      9 Best Language 0[x]  1[]  2[]  3[]      10 Dysarthria 0[x]  1[]  2[]  UN[]      11 Extinct. and Inattention 0[x]  1[]  2[]       TOTAL:1       ROS  Comprehensive ROS performed and pertinent positives documented in HPI   Past History   Past Medical History:  Diagnosis Date    Coronary atherosclerosis due to calcified coronary lesion    Hyperlipidemia    Hypertension    TIA (transient ischemic attack)     Past Surgical History:  Procedure Laterality Date   ABDOMINAL SURGERY     BREAST CYST ASPIRATION Right    ESOPHAGOGASTRODUODENOSCOPY (EGD) WITH PROPOFOL  Bilateral 03/01/2023   Procedure: ESOPHAGOGASTRODUODENOSCOPY (EGD) WITH PROPOFOL ;  Surgeon: Burnette Fallow, MD;  Location: [facility];  Service: Gastroenterology;  Laterality: Bilateral;   FINE NEEDLE ASPIRATION N/A 03/01/2023   Procedure: FINE NEEDLE ASPIRATION (FNA) LINEAR;  Surgeon: Burnette Fallow, MD;  Location: [facility];  Service: Gastroenterology;  Laterality: N/A;   LAPAROTOMY N/A 11/07/2022   Procedure: EXPLORATORY LAPAROTOMY LYSIS OF ADHESIONS;  Surgeon: Ebbie Cough, MD;  Location: [facility];  Service: General;  Laterality: N/A;   UPPER ESOPHAGEAL ENDOSCOPIC ULTRASOUND (EUS) Bilateral 03/01/2023   Procedure: UPPER ESOPHAGEAL ENDOSCOPIC ULTRASOUND (EUS);  Surgeon: Burnette Fallow, MD;  Location: [facility];  Service: Gastroenterology;  Laterality: Bilateral;    Family History: Family History  Problem Relation Age of Onset   Lung cancer Mother    Heart disease Father  Alcoholism Father    Liver disease Father    Multiple sclerosis Sister    Heart attack Brother    Breast cancer Neg Hx     Social History  reports that she quit smoking about 44 years ago. Her smoking use included cigarettes. She has never used smokeless tobacco. She reports current alcohol  use of about 2.0 standard drinks of alcohol  per week. She reports that she does not currently use drugs after having used the following drugs: Marijuana and LSD.  Allergies  Allergen Reactions   Latex Hives, Swelling, Dermatitis and Rash   Tape Hives, Itching, Swelling, Dermatitis, Rash and Other (See Comments)    No bandaging that contains latex!!   Gadolinium Derivatives Itching, Rash and Other (See Comments)    Pt had MRI  abdomen with contrast on 09/04/23. Pt called 2 weeks later (09/18/23) stating she experienced a delayed reaction later on the day of 09/04/23 with symptoms of itching/rash. She stated she was still experiencing some itching 2 weeks later.    Gadopiclenol  Itching, Nausea Only and Rash   Iodinated Contrast Media Dermatitis and Rash   Sulfa Antibiotics Itching, Rash and Other (See Comments)    Rash - as a child    Medications   Current Facility-Administered Medications:     stroke: early stages of recovery book, , Does not apply, Once, Nivia, Bowie, PA-C   acetaminophen  (TYLENOL ) tablet 650 mg, 650 mg, Oral, Q6H PRN, Stephens, Tiona K, MD   artificial tears ophthalmic solution 1 drop, 1 drop, Both Eyes, TID PRN, Lenor Hollering, MD   aspirin  EC tablet 81 mg, 81 mg, Oral, Daily, Stephens, Tiona K, MD, 81 mg at 09/20/24 1052   atorvastatin  (LIPITOR) tablet 40 mg, 40 mg, Oral, QHS, Stephens, Tiona K, MD, 40 mg at 09/19/24 2300   clopidogrel  (PLAVIX ) tablet 75 mg, 75 mg, Oral, Daily, Stephens, Tiona K, MD   levothyroxine  (SYNTHROID ) tablet 112.5 mcg, 112.5 mcg, Oral, Q Fri, 112.5 mcg at 09/20/24 9366 **AND** [START ON 09/21/2024] levothyroxine  (SYNTHROID ) tablet 75 mcg, 75 mcg, Oral, Once per day on Sunday Monday Tuesday Wednesday Thursday Saturday, Lorren Bradly POUR, MD   sodium chloride  flush (NS) 0.9 % injection 3 mL, 3 mL, Intravenous, Once, Nivia Colon, PA-C  Vitals   Vitals:   09/20/24 1000 09/20/24 1121 09/20/24 1324 09/20/24 1459  BP: (!) 108/56  114/69 131/67  Pulse: 65  69 65  Resp: 14  (!) 22 20  Temp:  97.8 F (36.6 C) 97.8 F (36.6 C) 97.9 F (36.6 C)  TempSrc:  Oral Oral Oral  SpO2: 95%  95% 95%  Weight:      Height:        Body mass index is 23.62 kg/m.   Physical Exam   Constitutional: Appears well-developed and well-nourished.  Psych: Affect appropriate to situation.  Eyes: No scleral injection.  HENT: No OP obstruction.  Head: Normocephalic.  Respiratory: Effort  normal, non-labored breathing.  Skin: WDI.   Neurologic Examination    NEURO:  Mental Status: AA&Ox3, able to give clear and coherent history of present illness Speech/Language: speech is without dysarthria or aphasia.  Naming, fluency, and comprehension intact.  Cranial Nerves:  II: PERRL. Visual fields full. III, IV, VI: EOMI. Eyelids elevate symmetrically.  V: Sensation is intact to light touch and symmetrical to face.  VII: Subtle left facial droop VIII: hearing intact to voice. IX, X: Palate elevates symmetrically. Phonation is normal.  XII: tongue is midline without fasciculations. Motor: Able to  move all 4 extremities with symmetrical antigravity strength Tone: is normal and bulk is normal Sensation- Intact to light touch bilaterally. Extinction absent to light touch to DSS. Sharp/Dull   Vibration.   Coordination: FTN intact bilaterally, HKS: no ataxia in BLE.N Gait- deferred      Labs/Imaging/Neurodiagnostic studies   CBC:  Recent Labs  Lab 10-19-24 1805 2024-10-19 1814  WBC 6.9  --   NEUTROABS 4.4  --   HGB 14.2 15.0  HCT 42.6 44.0  MCV 90.3  --   PLT 228  --    Basic Metabolic Panel:  Lab Results  Component Value Date   NA 136 2024/10/19   K 3.8 10-19-2024   CO2 25 October 19, 2024   GLUCOSE 109 (H) 10/19/2024   BUN 20 10-19-24   CREATININE 0.60 10/19/2024   CALCIUM  9.9 10/19/24   GFRNONAA >60 2024/10/19   GFRAA  09/30/2009    >60        The eGFR has been calculated using the MDRD equation. This calculation has not been validated in all clinical situations. eGFR's persistently <60 mL/min signify possible Chronic Kidney Disease.   Lipid Panel:  Lab Results  Component Value Date   LDLCALC 41 09/20/2024   HgbA1c:  Lab Results  Component Value Date   HGBA1C 5.4 10-19-24   Urine Drug Screen: No results found for: LABOPIA, COCAINSCRNUR, LABBENZ, AMPHETMU, THCU, LABBARB  Alcohol  Level     Component Value Date/Time   [facility] <15  10-19-2024 1805   INR  Lab Results  Component Value Date   INR 0.9 October 19, 2024   APTT  Lab Results  Component Value Date   APTT 24 10-19-24    CT Head without contrast(Personally reviewed): No acute abnormality  CT angio Head and Neck with contrast(Personally reviewed): No LVO or hemodynamically significant stenosis  MRI Brain(Personally reviewed): No acute abnormality, chronic small vessel ischemic disease   ASSESSMENT   Debra Herman is a 78 y.o. female with history of Bell's palsy TIA and ocular migraines who presents with a 30-minute episode of a alexia.  Patient reports that she first had an ocular migraine of her typical kind with squiggly lines in her vision which prevented reading.  This lasted about 30 minutes, and after the ocular migraine resolved, patient was still unable to make sense of the words on the page although she could see them clearly.  This again lasted for about 30 minutes.  Patient has had a similar episode several years ago.  MRI was negative for acute infarct, making TIA the most likely etiology of her symptoms.  Patient takes Plavix  daily, which is sufficient antithrombotic for stroke prevention.  LDL is 41, so current dose of statin is sufficient for stroke prevention.  CT angiogram demonstrates no significant intracranial stenosis, and echocardiogram demonstrates normal ejection fraction, normal left atrial size and no atrial level shunt.  As stroke workup is completed, patient could be discharged to follow-up with outpatient neurology.  RECOMMENDATIONS  -Continue Plavix  75 mg daily, add aspirin  81 mg for 3 weeks - Continue atorvastatin  40 mg daily - Follow-up in stroke clinic 8 weeks after discharge, patient will also need neurology follow-up for ocular migraine management ______________________________________________________________________  Patient seen by NP with MD, MD to edit note as needed.  Signed, Cortney E Everitt Clint Kill, NP [facility]   NEUROHOSPITALIST ADDENDUM Performed a face to face diagnostic evaluation.   I have reviewed the contents of history and physical exam as documented by PA/ARNP/Resident and  agree with above documentation.  I have discussed and formulated the above plan as documented. Edits to the note have been made as needed.  Malissie Musgrave, MD [facility] [phone]   If 7pm to 7am, please call on call as listed on AMION.

## 2024-09-20 NOTE — Progress Notes (Addendum)
 [facility] Progress Note  Patient: Debra Herman     FMW:[id number]  DOA: 09/19/2024   PCP: Aisha Harvey, MD       Brief hospital course: This is a 78 year old female with a history of TIA, Bell's palsy, migraines who presents to the hospital for visual disturbance and word finding difficulty.  She states that she has had visual migraines in the past and takes Advil for them.She tell me that her episode started with a right sided visual field cut as her visual migraines usually do. She then noticed that when she tried to read, the words did not make sense. When she tried to read out loud, she was not able to speak the words on the page but was able to speak other words that were similar to those on the page. Her speech was clear. This episodes lasted about 1- 1.5 hrs. It was followed by a headache.   Subjective:  No new complaints.  No further episodes.  Assessment and Plan: Principal Problem:   TIA (transient ischemic attack) vs complicated migraine - she is on Plavix  for a similar episode that happened 1 yr ago - MRI neg for CVA - cont Plavix  and ASA- transferred from [facility] to [facility] for a neurology evaluation (per neuro request) .2 D ECHO reve revealed grade 1 d dysfunction-no thrombus  Active Problems: Number  History of migraine headaches -Has had visual migraines in the past which she treats with Advil  Ascending aortic aneurysm - Measuring 3.9 cm - Continue Lipitor  Hypothyroidism - Continue Synthroid       Code Status: Prior Total time on patient care: 35 minutes DVT prophylaxis:  Ambulatory     Objective:   Vitals:   09/20/24 0336 09/20/24 0530 09/20/24 0541 09/20/24 0730  BP: 120/70 110/60  139/68  Pulse: 64 67  61  Resp: 18 18  16   Temp:   98 F (36.7 C) 97.8 F (36.6 C)  TempSrc:    Oral  SpO2: 96% 96%  98%  Weight:      Height:       Filed Weights   09/19/24 1801 09/19/24 2102  Weight: 56.7 kg 56.7 kg   Exam: General exam: Appears  comfortable  HEENT: oral mucosa moist Respiratory system: Clear to auscultation.  Cardiovascular system: S1 & S2 heard  Gastrointestinal system: Abdomen soft, non-tender, nondistended. Normal bowel sounds   Extremities: No cyanosis, clubbing or edema Psychiatry:  Mood & affect appropriate.      CBC: Recent Labs  Lab 09/19/24 1805 09/19/24 1814  WBC 6.9  --   NEUTROABS 4.4  --   HGB 14.2 15.0  HCT 42.6 44.0  MCV 90.3  --   PLT 228  --    Basic Metabolic Panel: Recent Labs  Lab 09/19/24 1805 09/19/24 1814  NA 135 136  K 3.9 3.8  CL 98 98  CO2 25  --   GLUCOSE 110* 109*  BUN 18 20  CREATININE 0.57 0.60  CALCIUM  9.9  --      Scheduled Meds:   stroke: early stages of recovery book   Does not apply Once   aspirin  EC  81 mg Oral Daily   atorvastatin   40 mg Oral QHS   clopidogrel   75 mg Oral Daily   levothyroxine   112.5 mcg Oral Q Fri   And   [START ON 09/21/2024] levothyroxine   75 mcg Oral Once per day on Sunday Monday Tuesday Wednesday Thursday Saturday   sodium chloride  flush  3 mL Intravenous Once    Imaging and lab data personally reviewed   Author: Johnelle Tafolla  09/20/2024 8:16 AM  To contact [facility]>   Check the care team in [facility] and look for the attending/consulting [facility] provider listed  Log into [email or web address] and use [place]'s universal password   Go to> [facility]  and find provider  If you still have difficulty reaching the provider, please page the [facility] (Director on Call) for the Hospitalists listed on amion

## 2024-09-20 NOTE — Progress Notes (Signed)
 OT Cancellation Note  Patient Details Name: Debra Herman MRN: [id number] DOB: 03-09-1946   Cancelled Treatment:    Reason Eval/Treat Not Completed: OT screened, no needs identified, will sign off. Orders received, chart reviewed. Pt states symptoms have resolved since arrival and has returned to baseline level of functioning. OT will complete current orders, please re-consult if new needs arise.   Sarabelle Genson L. Nikola Marone, OTR/L  09/20/24, 9:12 AM

## 2024-09-20 NOTE — Care Management CC44 (Signed)
 Condition Code 44 Documentation Completed  Patient Details  Name: Wilbur Labuda MRN: [id number] Date of Birth: Apr 03, 1946   Condition Code 44 given:  Yes Patient signature on Condition Code 44 notice:  Yes Documentation of 2 MD's agreement:  Yes Code 44 added to claim:  Yes    [name], RN 09/20/2024, 4:25 PM

## 2024-09-20 NOTE — ED Notes (Addendum)
 Report called to [facility], RN

## 2024-09-21 ENCOUNTER — Other Ambulatory Visit (HOSPITAL_COMMUNITY): Payer: Self-pay

## 2024-09-21 DIAGNOSIS — G459 Transient cerebral ischemic attack, unspecified: Secondary | ICD-10-CM | POA: Diagnosis not present

## 2024-09-21 MED ORDER — FAMOTIDINE 20 MG PO TABS
20.0000 mg | ORAL_TABLET | Freq: Every day | ORAL | 0 refills | Status: AC
  Filled 2024-09-21: qty 30, 30d supply, fill #0

## 2024-09-21 MED ORDER — ASPIRIN 81 MG PO TBEC
81.0000 mg | DELAYED_RELEASE_TABLET | Freq: Every day | ORAL | 0 refills | Status: AC
  Filled 2024-09-21: qty 21, 21d supply, fill #0

## 2024-09-21 NOTE — Evaluation (Signed)
 Speech Language Pathology Evaluation Patient Details Name: Debra Herman MRN: [id number] DOB: 09/24/1946 Today's Date: 09/21/2024 Time: 9052-9041 SLP Time Calculation (min) (ACUTE ONLY): 11 min  Problem List:  Patient Active Problem List   Diagnosis Date Noted   TIA (transient ischemic attack) 09/19/2024   H/O Bell's palsy 09/19/2024   Essential hypertension 09/19/2024   Hypothyroidism 09/19/2024   History of TIAs 09/19/2024   HLD (hyperlipidemia) 09/19/2024   S/P exploratory laparotomy 11/07/2022   Past Medical History:  Past Medical History:  Diagnosis Date   Coronary atherosclerosis due to calcified coronary lesion    Hyperlipidemia    Hypertension    TIA (transient ischemic attack)    Past Surgical History:  Past Surgical History:  Procedure Laterality Date   ABDOMINAL SURGERY     BREAST CYST ASPIRATION Right    ESOPHAGOGASTRODUODENOSCOPY (EGD) WITH PROPOFOL  Bilateral 03/01/2023   Procedure: ESOPHAGOGASTRODUODENOSCOPY (EGD) WITH PROPOFOL ;  Surgeon: Burnette Fallow, MD;  Location: [facility];  Service: Gastroenterology;  Laterality: Bilateral;   FINE NEEDLE ASPIRATION N/A 03/01/2023   Procedure: FINE NEEDLE ASPIRATION (FNA) LINEAR;  Surgeon: Burnette Fallow, MD;  Location: [facility];  Service: Gastroenterology;  Laterality: N/A;   LAPAROTOMY N/A 11/07/2022   Procedure: EXPLORATORY LAPAROTOMY LYSIS OF ADHESIONS;  Surgeon: Ebbie Cough, MD;  Location: [facility];  Service: General;  Laterality: N/A;   UPPER ESOPHAGEAL ENDOSCOPIC ULTRASOUND (EUS) Bilateral 03/01/2023   Procedure: UPPER ESOPHAGEAL ENDOSCOPIC ULTRASOUND (EUS);  Surgeon: Burnette Fallow, MD;  Location: [facility];  Service: Gastroenterology;  Laterality: Bilateral;   HPI:  In with visual disturbance and word difficulty.  PMH:  TIA, Bell's palsy impacting the left side of her face and migraines.  MRI of the brain was showing no acute intracranial abnormality.   Assessment / Plan /  Recommendation Clinical Impression  Cognitive/linguistic evaluation and motor speech screen were completed.  Cranial nerve exam was completed and remarkable for facial droop on the left consistent with patient's known Bell's palsy.  Otherwise, lingual and jaw range of motion and strength appeared to be intact.  Facial sensation appeared to be intact and she did not endorse a difference in sensation between the right and left side of her face.  Speech was clear and easy to understand.  No discernible dysarthria or apraxia were noted.  She completed the Mini Mental State Exam achieving an overall score of 28/30.  She was fully oriented to person, place, time and situation.  She had good immediate and delayed recall of 3 novel words.  Mild issues noted for the attention task with patient achieving a score of 3/5 for this task.  Language skills appeared to be grossly intact.  She was able to name objects, repeat a phrase, follow a 3 step command, read/comprehend a sentence, write a sentence and copy a design. Informally she was able to provide logical solutions to simple problems and accurately complete the clock drawing task.  The patient does complain of interimittent word finding issues that were not seen during this visit.  She was encouraged to follow up with her primary care doctor is this issue worsens.  ST follow up is not indicated at this time.  Please reconsult if we can be of further assistance.  MD and RN notified of results of this evaluation.    SLP Assessment  SLP Recommendation/Assessment: Patient does not need any further Speech Language Pathology Services SLP Visit Diagnosis: Cognitive communication deficit (R41.841)     Assistance Recommended at Discharge  None  Functional Status  Assessment Patient has not had a recent decline in their functional status        SLP Evaluation Cognition  Overall Cognitive Status: Within Functional Limits for tasks assessed Arousal/Alertness:  Awake/alert Orientation Level: Oriented X4 Attention: Sustained Sustained Attention: Impaired Sustained Attention Impairment: Verbal basic Memory: Appears intact Awareness: Appears intact Problem Solving: Appears intact Executive Function: Organizing;Initiating Organizing: Appears intact Initiating: Appears intact Safety/Judgment: Appears intact       Comprehension  Auditory Comprehension Overall Auditory Comprehension: Appears within functional limits for tasks assessed Commands: Within Functional Limits Conversation: Complex Reading Comprehension Reading Status: Within funtional limits    Expression Expression Primary Mode of Expression: Verbal Verbal Expression Overall Verbal Expression: Appears within functional limits for tasks assessed Initiation: No impairment Automatic Speech: Name;Social Response Level of Generative/Spontaneous Verbalization: Conversation Repetition: No impairment Naming: No impairment Pragmatics: No impairment Non-Verbal Means of Communication: Not applicable Written Expression Dominant Hand: Right Written Expression: Within Functional Limits   Oral / Motor  Oral Motor/Sensory Function Overall Oral Motor/Sensory Function: Mild impairment Facial ROM: Reduced left;Suspected CN VII (facial) dysfunction Facial Symmetry: Suspected CN VII (facial) dysfunction;Abnormal symmetry left Facial Strength: Suspected CN VII (facial) dysfunction;Reduced left Facial Sensation: Within Functional Limits Lingual ROM: Within Functional Limits Lingual Symmetry: Within Functional Limits Lingual Strength: Within Functional Limits Mandible: Within Functional Limits Motor Speech Overall Motor Speech: Appears within functional limits for tasks assessed Motor Speech Errors: Not applicable           Eleanor Eagles, MA, CCC-SLP Acute Rehab SLP [phone]  Eleanor LOISE Eagles 09/21/2024, 10:08 AM

## 2024-09-21 NOTE — Plan of Care (Signed)
 Problem: Education: Goal: Knowledge of disease or condition will improve 09/21/2024 0922 by Jerona Expose, Aquilla HERO, RN Outcome: Completed/Met 09/21/2024 9077 by Jerona Expose, Aquilla HERO, RN Outcome: Progressing Goal: Knowledge of secondary prevention will improve (MUST DOCUMENT ALL) 09/21/2024 0922 by Jerona Expose Aquilla HERO, RN Outcome: Completed/Met 09/21/2024 0922 by Jerona Expose, Aquilla HERO, RN Outcome: Progressing Goal: Knowledge of patient specific risk factors will improve (DELETE if not current risk factor) 09/21/2024 0922 by Jerona Expose Aquilla HERO, RN Outcome: Completed/Met 09/21/2024 0922 by Jerona Expose, Aquilla HERO, RN Outcome: Progressing   Problem: Ischemic Stroke/TIA Tissue Perfusion: Goal: Complications of ischemic stroke/TIA will be minimized 09/21/2024 0922 by Jerona Expose Aquilla HERO, RN Outcome: Completed/Met 09/21/2024 0922 by Jerona Expose Aquilla HERO, RN Outcome: Progressing   Problem: Coping: Goal: Will verbalize positive feelings about self 09/21/2024 0922 by Jerona Expose Aquilla HERO, RN Outcome: Completed/Met 09/21/2024 0922 by Jerona Expose Aquilla HERO, RN Outcome: Progressing Goal: Will identify appropriate support needs 09/21/2024 0922 by Jerona Expose Aquilla HERO, RN Outcome: Completed/Met 09/21/2024 9077 by Jerona Expose Aquilla HERO, RN Outcome: Progressing   Problem: Health Behavior/Discharge Planning: Goal: Ability to manage health-related needs will improve 09/21/2024 0922 by Jerona Expose Aquilla HERO, RN Outcome: Completed/Met 09/21/2024 9077 by Jerona Expose, Aquilla HERO, RN Outcome: Progressing Goal: Goals will be collaboratively established with patient/family 09/21/2024 [phone] by Jerona Expose Aquilla HERO, RN Outcome: Completed/Met 09/21/2024 0922 by Jerona Expose Aquilla HERO, RN Outcome: Progressing    Problem: Self-Care: Goal: Ability to participate in self-care as condition permits will improve 09/21/2024 0922 by Jerona Expose, Aquilla HERO, RN Outcome: Completed/Met 09/21/2024 9077 by Jerona Expose Aquilla HERO, RN Outcome: Progressing Goal: Verbalization of feelings and concerns over difficulty with self-care will improve 09/21/2024 0922 by Jerona Expose Aquilla HERO, RN Outcome: Completed/Met 09/21/2024 9077 by Jerona Expose Aquilla HERO, RN Outcome: Progressing Goal: Ability to communicate needs accurately will improve 09/21/2024 0922 by Jerona Expose Aquilla HERO, RN Outcome: Completed/Met 09/21/2024 9077 by Jerona Expose Aquilla HERO, RN Outcome: Progressing   Problem: Nutrition: Goal: Risk of aspiration will decrease 09/21/2024 0922 by Jerona Expose Aquilla HERO, RN Outcome: Completed/Met 09/21/2024 9077 by Jerona Expose Aquilla HERO, RN Outcome: Progressing Goal: Dietary intake will improve 09/21/2024 9077 by Jerona Expose Aquilla HERO, RN Outcome: Completed/Met 09/21/2024 9077 by Jerona Expose, Aquilla HERO, RN Outcome: Progressing   Problem: Education: Goal: Knowledge of General Education information will improve Description: Including pain rating scale, medication(s)/side effects and non-pharmacologic comfort measures 09/21/2024 0922 by Jerona Expose, Aquilla HERO, RN Outcome: Completed/Met 09/21/2024 0922 by Jerona Expose Aquilla HERO, RN Outcome: Progressing   Problem: Health Behavior/Discharge Planning: Goal: Ability to manage health-related needs will improve 09/21/2024 0922 by Jerona Expose Aquilla HERO, RN Outcome: Completed/Met 09/21/2024 9077 by Jerona Expose Aquilla HERO, RN Outcome: Progressing   Problem: Clinical Measurements: Goal: Ability to maintain clinical measurements within normal limits will improve 09/21/2024 0922 by Jerona Expose Aquilla HERO,  RN Outcome: Completed/Met 09/21/2024 9077 by Jerona Expose Aquilla HERO, RN Outcome: Progressing Goal: Will remain free from infection 09/21/2024 9077 by Jerona Expose Aquilla HERO, RN Outcome: Completed/Met 09/21/2024 9077 by Jerona Expose Aquilla HERO, RN Outcome: Progressing Goal: Diagnostic test results will improve 09/21/2024 0922 by Jerona Expose Aquilla HERO, RN Outcome: Completed/Met 09/21/2024 9077 by Jerona Expose Aquilla HERO, RN Outcome: Progressing Goal: Respiratory complications will improve 09/21/2024 9077 by Jerona Expose Aquilla HERO, RN Outcome: Completed/Met 09/21/2024 9077 by Jerona Expose Aquilla HERO, RN Outcome: Progressing Goal: Cardiovascular complication will be avoided 09/21/2024 0922 by  Fanbissi Ketchiozo, Aquilla HERO, RN Outcome: Completed/Met 09/21/2024 9077 by Jerona Michael Aquilla HERO, RN Outcome: Progressing   Problem: Activity: Goal: Risk for activity intolerance will decrease 09/21/2024 0922 by Jerona Michael, Aquilla HERO, RN Outcome: Completed/Met 09/21/2024 9077 by Jerona Michael, Aquilla HERO, RN Outcome: Progressing   Problem: Nutrition: Goal: Adequate nutrition will be maintained 09/21/2024 0922 by Jerona Michael Aquilla HERO, RN Outcome: Completed/Met 09/21/2024 9077 by Jerona Michael Aquilla HERO, RN Outcome: Progressing   Problem: Coping: Goal: Level of anxiety will decrease 09/21/2024 0922 by Jerona Michael Aquilla HERO, RN Outcome: Completed/Met 09/21/2024 9077 by Jerona Michael Aquilla HERO, RN Outcome: Progressing   Problem: Elimination: Goal: Will not experience complications related to bowel motility 09/21/2024 0922 by Jerona Michael Aquilla HERO, RN Outcome: Completed/Met 09/21/2024 9077 by Jerona Michael Aquilla HERO, RN Outcome: Progressing Goal: Will not experience complications related to urinary retention 09/21/2024 0922 by  Jerona Michael Aquilla HERO, RN Outcome: Completed/Met 09/21/2024 9077 by Jerona Michael Aquilla HERO, RN Outcome: Progressing   Problem: Pain Managment: Goal: General experience of comfort will improve and/or be controlled 09/21/2024 0922 by Jerona Michael Aquilla HERO, RN Outcome: Completed/Met 09/21/2024 9077 by Jerona Michael Aquilla HERO, RN Outcome: Progressing   Problem: Safety: Goal: Ability to remain free from injury will improve 09/21/2024 0922 by Jerona Michael Aquilla HERO, RN Outcome: Completed/Met 09/21/2024 9077 by Jerona Michael Aquilla HERO, RN Outcome: Progressing   Problem: Skin Integrity: Goal: Risk for impaired skin integrity will decrease 09/21/2024 0922 by Jerona Michael Aquilla HERO, RN Outcome: Completed/Met 09/21/2024 9077 by Jerona Michael Aquilla HERO, RN Outcome: Progressing

## 2024-09-21 NOTE — TOC Transition Note (Signed)
 Transition of Care [facility]) - Discharge Note   Patient Details  Name: Debra Herman MRN: [id number] Date of Birth: 1945-11-02  Transition of Care [facility]) CM/SW Contact:  Tom-Johnson, Harvest Muskrat, RN Phone Number: 09/21/2024, 9:40 AM   Clinical Narrative:     Patient is scheduled for discharge today.  Readmission Risk Assessment done. Outpatient f/u, hospital f/u and discharge instructions on AVS. Prescriptions sent to [facility] pharmacy and patient will receive meds prior discharge. No ICM needs or recommendations noted. Husband, Joe to transport at discharge.  No further ICM needs noted.     Final next level of care: Home/Self Care Barriers to Discharge: Barriers Resolved   Patient Goals and CMS Choice Patient states their goals for this hospitalization and ongoing recovery are:: To return home CMS Medicare.gov Compare Post Acute Care list provided to:: Patient Choice offered to / list presented to : NA      Discharge Placement                Patient to be transferred to facility by: Husband Name of family member notified: Joe    Discharge Plan and Services Additional resources added to the After Visit Summary for                  DME Arranged: N/A DME Agency: NA       HH Arranged: NA HH Agency: NA        Social Drivers of Health (SDOH) Interventions SDOH Screenings   Food Insecurity: No Food Insecurity (09/20/2024)  Housing: Low Risk  (09/20/2024)  Transportation Needs: No Transportation Needs (09/20/2024)  Utilities: Not At Risk (09/20/2024)  Social Connections: Socially Integrated (09/20/2024)  Tobacco Use: Medium Risk (09/19/2024)     Readmission Risk Interventions    09/21/2024    9:39 AM  Readmission Risk Prevention Plan  Post Dischage Appt Complete  Medication Screening Complete  Transportation Screening Complete

## 2024-09-21 NOTE — Plan of Care (Signed)
  Problem: Education: Goal: Knowledge of disease or condition will improve Outcome: Progressing Goal: Knowledge of secondary prevention will improve (MUST DOCUMENT ALL) Outcome: Progressing   Problem: Ischemic Stroke/TIA Tissue Perfusion: Goal: Complications of ischemic stroke/TIA will be minimized Outcome: Progressing   Problem: Education: Goal: Knowledge of General Education information will improve Description: Including pain rating scale, medication(s)/side effects and non-pharmacologic comfort measures Outcome: Progressing   Problem: Clinical Measurements: Goal: Will remain free from infection Outcome: Progressing

## 2024-09-21 NOTE — Discharge Summary (Signed)
 Discharge summary note.  Debra Herman FMW:[id number] DOB: March 23, 1946 DOA: 09/19/2024  PCP: Aisha Harvey, MD  Admit date: 09/19/2024  Discharge date: 09/21/2024  Admitted From: Home   Disposition:  Home   Recommendations for Outpatient Follow-up:   Follow up with PCP in 1-2 weeks  PCP Please obtain BMP/CBC, 2 view CXR in 1week,  (see Discharge instructions)   PCP Please follow up on the following pending results:    Home Health: Continue previous outpatient PT as before Equipment/Devices: None Consultations: Neurology Discharge Condition: Stable    CODE STATUS: Full    Diet Recommendation: Heart Healthy     Chief Complaint  Patient presents with   Aphasia     Brief history of present illness from the day of admission and additional interim summary    77 year old female with a history of TIA, Bell's palsy, migraines who presents to the hospital for visual disturbance and word finding difficulty.  She states that she has had visual migraines in the past and takes Advil for them.She tell me that her episode started with a right sided visual field cut as her visual migraines usually do. She then noticed that when she tried to read, the words did not make sense. When she tried to read out loud, she was not able to speak the words on the page but was able to speak other words that were similar to those on the page. Her speech was clear. This episodes lasted about 1- 1.5 hrs. It was followed by a headache.                                                                  Hospital Course   Transient expressive aphasia due to TIA (transient ischemic attack) vs complicated migraine - she is on Plavix  for a similar episode that happened 1 yr ago, symptoms have resolved she is back to baseline, MRI brain  unremarkable, stable on telemetry, stable echo, stable A1c and LDL.  Case discussed with neurology in detail, DAPT 3 weeks thereafter continue home Plavix , continue statin unchanged.  She already has outpatient PT which she will continue, will be seen by speech prior to discharge if needed, of note her symptoms have completely resolved she has previous left-sided facial droop from underlying Bell's palsy.  Will follow-up with neurology 1 time postdischarge.    History of migraine headaches -Has had visual migraines in the past which she treats with Advil   ? Mild Ascending aortic aneurysm on previous imaging - Measuring 3.9 cm - Continue Lipitor, continue beta-blocker and HCTZ combination as well.  PCP to monitor.  May benefit from one-time outpatient cardiology follow-up.   Hypothyroidism - Continue Synthroid     Discharge diagnosis     Principal Problem:   TIA (transient ischemic  attack) Active Problems:   H/O Bell's palsy   Essential hypertension   Hypothyroidism   History of TIAs   HLD (hyperlipidemia)    Discharge instructions    Discharge Instructions     Discharge instructions   Complete by: As directed    Follow with Primary MD Aisha Harvey, MD in 7 days   Get CBC, CMP, Magnesium  -  checked next visit with your primary MD    Activity: As tolerated with Full fall precautions use walker/cane & assistance as needed  Disposition Home    Diet: Heart Healthy low carbohydrate  Special Instructions: If you have smoked or chewed Tobacco  in the last 2 yrs please stop smoking, stop any regular Alcohol   and or any Recreational drug use.  On your next visit with your primary care physician please Get Medicines reviewed and adjusted.  Please request your Prim.MD to go over all Hospital Tests and Procedure/Radiological results at the follow up, please get all Hospital records sent to your Prim MD by signing hospital release before you go home.  If you experience worsening  of your admission symptoms, develop shortness of breath, life threatening emergency, suicidal or homicidal thoughts you must seek medical attention immediately by calling 911 or calling your MD immediately  if symptoms less severe.  You Must read complete instructions/literature along with all the possible adverse reactions/side effects for all the Medicines you take and that have been prescribed to you. Take any new Medicines after you have completely understood and accpet all the possible adverse reactions/side effects.   Do not drive when taking Pain medications.  Do not take more than prescribed Pain, Sleep and Anxiety Medications  Wear Seat belts while driving.   Increase activity slowly   Complete by: As directed        Discharge Medications   Allergies as of 09/21/2024       Reactions   Latex Hives, Swelling, Dermatitis, Rash   Tape Hives, Itching, Swelling, Dermatitis, Rash, Other (See Comments)   No bandaging that contains latex!!   Gadolinium Derivatives Itching, Rash, Other (See Comments)   Pt had MRI abdomen with contrast on 09/04/23. Pt called 2 weeks later (09/18/23) stating she experienced a delayed reaction later on the day of 09/04/23 with symptoms of itching/rash. She stated she was still experiencing some itching 2 weeks later.    Gadopiclenol  Itching, Nausea Only, Rash   Iodinated Contrast Media Dermatitis, Rash   Sulfa Antibiotics Itching, Rash, Other (See Comments)   Rash - as a child        Medication List     TAKE these medications    acetaminophen  500 MG tablet Commonly known as: TYLENOL  Take 2 tablets (1,000 mg total) by mouth every 6 (six) hours as needed. What changed: reasons to take this   Artificial Tears 1 % ophthalmic solution Generic drug: carboxymethylcellulose Place 1 drop into both eyes 3 (three) times daily as needed (for dryness).   aspirin  EC 81 MG tablet Take 1 tablet (81 mg total) by mouth daily for 21 days. Swallow whole.    atorvastatin  40 MG tablet Commonly known as: LIPITOR Take 40 mg by mouth at bedtime.   bisoprolol -hydrochlorothiazide  5-6.25 MG tablet Commonly known as: ZIAC  Take 1 tablet by mouth daily.   cholecalciferol 25 MCG (1000 UNIT) tablet Commonly known as: VITAMIN D3 Take 1,000 Units by mouth daily.   clopidogrel  75 MG tablet Commonly known as: PLAVIX  Take 1 tablet (75 mg total) by mouth  every evening.   ESTRACE VAGINAL 0.1 MG/GM Crea vaginal cream Generic drug: estradiol Place 1 Applicatorful vaginally 2 (two) times a week.   famotidine  20 MG tablet Commonly known as: Pepcid  Take 1 tablet (20 mg total) by mouth daily.   ICAPS AREDS 2 PO Take 1 capsule by mouth 2 (two) times daily with a meal.   levothyroxine  75 MCG tablet Commonly known as: SYNTHROID  Take 75-112.5 mcg by mouth See admin instructions. Take 75 mcg by mouth 30 minutes before breakfast on Sun/Mon/Tues/Wed/Thurs/Sat and 112.5 mcg on Fridays   metFORMIN 500 MG 24 hr tablet Commonly known as: GLUCOPHAGE-XR Take 500 mg by mouth See admin instructions. Take 500 mg by mouth with breakfast and supper/evening meal         Follow-up Information     [facility] Follow up in 3 day(s).   Contact information: [address]     [address] [place]  [address] [phone]        Aisha Harvey, MD. Schedule an appointment as soon as possible for a visit in 1 week(s).   Specialty: Family Medicine Contact information: [address] [place] [address] [phone]                 Major procedures and Radiology Reports - PLEASE review detailed and final reports thoroughly  -       ECHOCARDIOGRAM COMPLETE Result Date: 09/20/2024    ECHOCARDIOGRAM REPORT   Patient Name:   TYSHELL RAMBERG Date of Exam: 09/20/2024 Medical Rec #:  [id number]        Height:       61.0 in Accession #:    [id number]       Weight:       125.0 lb Date of Birth:  Sep 16, 1946        BSA:           1.547 m Patient Age:    78 years         BP:           139/68 mmHg Patient Gender: F                HR:           69 bpm. Exam Location:  Inpatient Procedure: 2D Echo, Cardiac Doppler and Color Doppler (Both Spectral and Color            Flow Doppler were utilized during procedure). Indications:    TIA G45.9  History:        Patient has prior history of Echocardiogram examinations, most                 recent 06/15/2023. TIA; Risk Factors:Hypertension and                 Dyslipidemia.  Sonographer:    Koleen Popper RDCS Referring Phys: 75 BOWIE TRAN IMPRESSIONS  1. Left ventricular ejection fraction, by estimation, is 65 to 70%. The left ventricle has normal function. The left ventricle has no regional wall motion abnormalities. Left ventricular diastolic parameters are consistent with Grade I diastolic dysfunction (impaired relaxation).  2. Right ventricular systolic function is normal. The right ventricular size is normal. Tricuspid regurgitation signal is inadequate for assessing PA pressure.  3. The mitral valve is normal in structure. Mild mitral valve regurgitation. No evidence of mitral stenosis.  4. The aortic valve is tricuspid. Aortic valve regurgitation is mild. Aortic valve sclerosis is present, with no evidence of aortic  valve stenosis.  5. The inferior vena cava is normal in size with greater than 50% respiratory variability, suggesting right atrial pressure of 3 mmHg. Comparison(s): No prior Echocardiogram. FINDINGS  Left Ventricle: Left ventricular ejection fraction, by estimation, is 65 to 70%. The left ventricle has normal function. The left ventricle has no regional wall motion abnormalities. The left ventricular internal cavity size was normal in size. There is  no left ventricular hypertrophy. Left ventricular diastolic parameters are consistent with Grade I diastolic dysfunction (impaired relaxation). Right Ventricle: The right ventricular size is normal. Right ventricular systolic  function is normal. Tricuspid regurgitation signal is inadequate for assessing PA pressure. The tricuspid regurgitant velocity is 2.48 m/s, and with an assumed right atrial  pressure of 3 mmHg, the estimated right ventricular systolic pressure is 27.6 mmHg. Left Atrium: Left atrial size was normal in size. Right Atrium: Right atrial size was normal in size. Pericardium: There is no evidence of pericardial effusion. Mitral Valve: The mitral valve is normal in structure. Mild mitral valve regurgitation. No evidence of mitral valve stenosis. Tricuspid Valve: The tricuspid valve is normal in structure. Tricuspid valve regurgitation is mild . No evidence of tricuspid stenosis. Aortic Valve: The aortic valve is tricuspid. Aortic valve regurgitation is mild. Aortic regurgitation PHT measures 714 msec. Aortic valve sclerosis is present, with no evidence of aortic valve stenosis. Pulmonic Valve: The pulmonic valve was normal in structure. Pulmonic valve regurgitation is trivial. No evidence of pulmonic stenosis. Aorta: The aortic root is normal in size and structure. Venous: The inferior vena cava is normal in size with greater than 50% respiratory variability, suggesting right atrial pressure of 3 mmHg. IAS/Shunts: No atrial level shunt detected by color flow Doppler.  LEFT VENTRICLE PLAX 2D LVIDd:         4.40 cm     Diastology LVIDs:         2.50 cm     LV e' medial:    4.68 cm/s LV PW:         0.90 cm     LV E/e' medial:  13.9 LV IVS:        0.90 cm     LV e' lateral:   6.42 cm/s LVOT diam:     1.90 cm     LV E/e' lateral: 10.1 LV SV:         41 LV SV Index:   26 LVOT Area:     2.84 cm  LV Volumes (MOD) LV vol d, MOD A4C: 80.5 ml LV vol s, MOD A4C: 35.2 ml LV SV MOD A4C:     80.5 ml RIGHT VENTRICLE             IVC RV S prime:     11.30 cm/s  IVC diam: 1.70 cm TAPSE (M-mode): 2.3 cm LEFT ATRIUM             Index LA diam:        3.10 cm 2.00 cm/m LA Vol (A2C):   31.1 ml 20.11 ml/m LA Vol (A4C):   30.2 ml 19.52 ml/m LA  Biplane Vol: 33.9 ml 21.92 ml/m  AORTIC VALVE LVOT Vmax:         70.30 cm/s LVOT Vmean:        45.400 cm/s LVOT VTI:          0.144 m AI PHT:            714 msec AR Vena Contracta: 0.30 cm  AORTA Ao  Root diam: 3.00 cm MITRAL VALVE               TRICUSPID VALVE MV Area (PHT): 3.46 cm    TR Peak grad:   24.6 mmHg MV Decel Time: 219 msec    TR Mean grad:   16.0 mmHg MV E velocity: 65.10 cm/s  TR Vmax:        248.00 cm/s MV A velocity: 81.40 cm/s  TR Vmean:       194.0 cm/s MV E/A ratio:  0.80                            SHUNTS                            Systemic VTI:  0.14 m                            Systemic Diam: 1.90 cm Redell Shallow MD Electronically signed by Redell Shallow MD Signature Date/Time: 09/20/2024/2:13:27 PM    Final    MR BRAIN WO CONTRAST Result Date: 09/20/2024 EXAM: MRI BRAIN WITHOUT CONTRAST 09/20/2024 06:16:48 AM TECHNIQUE: Multiplanar multisequence MRI of the head/brain was performed without the administration of intravenous contrast. COMPARISON: CT head, CTA head and neck yesterday. Previous brain MRI 04/20/2020. CLINICAL HISTORY: 78 year old female. Transient ischemic attack (TIA). FINDINGS: BRAIN AND VENTRICLES: No acute infarct. No intracranial hemorrhage. No mass. No midline shift. No hydrocephalus. Brain volume is stable and within normal limits for age. No cortical encephalomalacia or chronic cerebral blood products. Scattered periventricular and other bilateral cerebral white matter T2 and FLAIR hyperintensity is chronic and mildly progressed since 2021, mild to moderate for age now. Deep gray nuclei, brainstem, and cerebellum remain within normal limits. Normal flow voids. ORBITS: Normal suprasellar cistern and optic chiasm. Visible orbital soft tissues aside from interval postoperative changes to both globes. SINUSES AND MASTOIDS: Paranasal sinuses and mastoids remain well aerated. BONES AND SOFT TISSUES: Normal marrow signal and visible cervical spine. No acute soft tissue  abnormality. IMPRESSION: 1. No acute intracranial abnormality. 2. Mildly progressed chronic cerebral white matter changes since 2021, most commonly due to small vessel disease. Electronically signed by: Helayne Hurst MD 09/20/2024 06:26 AM EST RP Workstation: [id number]   CT ANGIO HEAD NECK W WO CM Result Date: 09/19/2024 EXAM: CTA HEAD AND NECK WITH CONTRAST 09/19/2024 08:34:01 PM TECHNIQUE: CTA of the head and neck was performed with the administration of intravenous contrast. A non-contrast CT of the head was performed prior to the administration of intravenous contrast. Multiplanar 2D and/or 3D reformatted images are provided for review. Automated exposure control, iterative reconstruction, and/or weight based adjustment of the mA/kV was utilized to reduce the radiation dose to as low as reasonably achievable. Stenosis of the internal carotid arteries measured using NASCET criteria. CONTRAST: 75 mL (iohexol  (OMNIPAQUE ) 350 MG/ML injection 75 mL IOHEXOL  350 MG/ML SOLN) COMPARISON: None available CLINICAL HISTORY: Neuro deficit, acute, stroke suspected. FINDINGS: CTA NECK: AORTIC ARCH AND ARCH VESSELS: No dissection or arterial injury. No significant stenosis of the brachiocephalic or subclavian arteries. CERVICAL CAROTID ARTERIES: No dissection, arterial injury, or hemodynamically significant stenosis by NASCET criteria. CERVICAL VERTEBRAL ARTERIES: No dissection, arterial injury, or significant stenosis. LUNGS AND MEDIASTINUM: Hazy biapical pulmonary opacities, possibly mild pulmonary edema. SOFT TISSUES: No acute abnormality. BONES: No acute abnormality. CTA HEAD: ANTERIOR CIRCULATION: No  significant stenosis of the internal carotid arteries. No significant stenosis of the anterior cerebral arteries. No significant stenosis of the middle cerebral arteries. No aneurysm. POSTERIOR CIRCULATION: No significant stenosis of the posterior cerebral arteries. No significant stenosis of the basilar artery. No  significant stenosis of the vertebral arteries. No aneurysm. OTHER: No dural venous sinus thrombosis on this non-dedicated study. CT BRAIN: BRAIN AND VENTRICLES: No acute hemorrhage. No evidence of acute infarct. No hydrocephalus. No extra-axial collection. No mass effect or midline shift. ORBITS: No acute abnormality. SINUSES: No acute abnormality. SOFT TISSUES AND SKULL: No acute soft tissue abnormality. No skull fracture. IMPRESSION: 1. No large vessel occlusion, hemodynamically significant stenosis, or aneurysm in the head or neck. 2. Hazy biapical pulmonary opacities, which may reflect mild pulmonary edema. Electronically signed by: Franky Stanford MD 09/19/2024 09:10 PM EST RP Workstation: [id number]    Micro Results     No results found for this or any previous visit (from the past 240 hours).  Today   Subjective    Debra Herman today has no headache,no chest abdominal pain,no new weakness tingling or numbness, feels much better wants to go home today.     Objective   Blood pressure (!) 114/47, pulse 72, temperature 97.6 F (36.4 C), temperature source Oral, resp. rate 15, height 5' 1 (1.549 m), weight 56.7 kg, SpO2 95%.  No intake or output data in the 24 hours ending 09/21/24 0851  Exam  Awake Alert, No new F.N deficits,   chronic left-sided facial droop [place].AT,PERRAL Supple Neck,   Symmetrical Chest wall movement, Good air movement bilaterally, CTAB RRR,No Gallops,   +ve B.Sounds, Abd Soft, Non tender,  No Cyanosis, Clubbing or edema    Data Review   Recent Labs  Lab 09/19/24 1805 09/19/24 1814  WBC 6.9  --   HGB 14.2 15.0  HCT 42.6 44.0  PLT 228  --   MCV 90.3  --   MCH 30.1  --   MCHC 33.3  --   RDW 13.4  --   LYMPHSABS 1.5  --   MONOABS 0.8  --   EOSABS 0.1  --   BASOSABS 0.1  --     Recent Labs  Lab 09/19/24 1805 09/19/24 1814  NA 135 136  K 3.9 3.8  CL 98 98  CO2 25  --   ANIONGAP 12  --   GLUCOSE 110* 109*  BUN 18 20  CREATININE 0.57  0.60  AST 60*  --   ALT 26  --   ALKPHOS 71  --   BILITOT 0.7  --   ALBUMIN 4.4  --   INR 0.9  --   HGBA1C 5.4  --   CALCIUM  9.9  --     Total Time in preparing paper work, data evaluation and todays exam - 35 minutes  Signature  -    Lavada Stank M.D on 09/21/2024 at 8:51 AM   -  To page go to [email or web address]

## 2024-09-21 NOTE — Plan of Care (Signed)

## 2024-09-23 DIAGNOSIS — Z0000 Encounter for general adult medical examination without abnormal findings: Secondary | ICD-10-CM | POA: Diagnosis not present

## 2024-09-26 DIAGNOSIS — R262 Difficulty in walking, not elsewhere classified: Secondary | ICD-10-CM | POA: Diagnosis not present

## 2024-09-27 DIAGNOSIS — I10 Essential (primary) hypertension: Secondary | ICD-10-CM | POA: Diagnosis not present

## 2024-09-27 DIAGNOSIS — Z79899 Other long term (current) drug therapy: Secondary | ICD-10-CM | POA: Diagnosis not present

## 2024-09-27 DIAGNOSIS — R7989 Other specified abnormal findings of blood chemistry: Secondary | ICD-10-CM | POA: Diagnosis not present

## 2024-09-27 DIAGNOSIS — R7303 Prediabetes: Secondary | ICD-10-CM | POA: Diagnosis not present

## 2024-09-27 DIAGNOSIS — E039 Hypothyroidism, unspecified: Secondary | ICD-10-CM | POA: Diagnosis not present

## 2024-10-01 DIAGNOSIS — R7989 Other specified abnormal findings of blood chemistry: Secondary | ICD-10-CM | POA: Diagnosis not present

## 2024-10-01 DIAGNOSIS — I10 Essential (primary) hypertension: Secondary | ICD-10-CM | POA: Diagnosis not present

## 2024-10-01 DIAGNOSIS — E039 Hypothyroidism, unspecified: Secondary | ICD-10-CM | POA: Diagnosis not present

## 2024-10-01 DIAGNOSIS — N952 Postmenopausal atrophic vaginitis: Secondary | ICD-10-CM | POA: Diagnosis not present

## 2024-10-01 DIAGNOSIS — Z8673 Personal history of transient ischemic attack (TIA), and cerebral infarction without residual deficits: Secondary | ICD-10-CM | POA: Diagnosis not present

## 2024-10-01 DIAGNOSIS — R7303 Prediabetes: Secondary | ICD-10-CM | POA: Diagnosis not present

## 2024-10-01 DIAGNOSIS — Z6824 Body mass index (BMI) 24.0-24.9, adult: Secondary | ICD-10-CM | POA: Diagnosis not present

## 2024-10-01 DIAGNOSIS — E782 Mixed hyperlipidemia: Secondary | ICD-10-CM | POA: Diagnosis not present

## 2024-10-01 DIAGNOSIS — I7121 Aneurysm of the ascending aorta, without rupture: Secondary | ICD-10-CM | POA: Diagnosis not present

## 2024-10-01 DIAGNOSIS — I2510 Atherosclerotic heart disease of native coronary artery without angina pectoris: Secondary | ICD-10-CM | POA: Diagnosis not present

## 2024-10-02 ENCOUNTER — Ambulatory Visit

## 2024-10-02 ENCOUNTER — Ambulatory Visit: Admitting: Cardiology

## 2024-10-02 ENCOUNTER — Encounter: Payer: Self-pay | Admitting: Cardiology

## 2024-10-02 VITALS — BP 135/73 | HR 68 | Resp 16 | Ht 61.0 in | Wt 124.6 lb

## 2024-10-02 DIAGNOSIS — G459 Transient cerebral ischemic attack, unspecified: Secondary | ICD-10-CM

## 2024-10-02 DIAGNOSIS — I10 Essential (primary) hypertension: Secondary | ICD-10-CM | POA: Diagnosis present

## 2024-10-02 DIAGNOSIS — E782 Mixed hyperlipidemia: Secondary | ICD-10-CM | POA: Diagnosis present

## 2024-10-02 DIAGNOSIS — Z87891 Personal history of nicotine dependence: Secondary | ICD-10-CM | POA: Insufficient documentation

## 2024-10-02 DIAGNOSIS — I77810 Thoracic aortic ectasia: Secondary | ICD-10-CM | POA: Diagnosis present

## 2024-10-02 DIAGNOSIS — R931 Abnormal findings on diagnostic imaging of heart and coronary circulation: Secondary | ICD-10-CM | POA: Diagnosis present

## 2024-10-02 NOTE — Progress Notes (Unsigned)
 Enrolled patient for a 14 day Zio XT  monitor to be mailed to patients home

## 2024-10-02 NOTE — Progress Notes (Signed)
 Cardiology Office Note:  .   Date:  10/02/2024  ID:  Debra Herman, DOB [birth year], MRN [id number] PCP:  Aisha Harvey, MD  [facility] Providers Cardiologist:  Madonna Large, DO , [facility] (established care 05/26/2023) Electrophysiologist:  None  Click to update primary MD,subspecialty MD or APP then REFRESH:1}    History of Present Illness: .   Debra Herman is a 78 y.o. Caucasian female whose past medical history and cardiovascular risk factors includes: Coronary calcification, hypertension, hyperlipidemia, history of TIA 2014, migraine, hypothyroidism, former smoker.  Patient was referred to the practice for evaluation of coronary calcification.  In March 2024 she underwent PET scan for suspected pancreatic head lesion and was noted to have coronary calcification.  She underwent dedicated coronary calcium  score which noted moderate CAC with a total score of 311, placing her at the 79th percentile.  Prior to establishing care patient was already placed on antiplatelet therapy as well as statin.  Her statin therapy was increased from atorvastatin  20 mg to 40 mg p.o. nightly by PCP.  Patient was last seen in the office in October 2024 and she presents today for 1 year follow-up visit.    Patient was recently hospitalized from September 19, 2024 through September 21, 2024 for TIA.  She presented to the hospital due to concerns for visual disturbances and difficulty with word finding.  Patient states that this is her third TIA since 2015.  No obvious residual deficits.  She had several tests during her recent hospitalization.  Clinically denies anginal chest pain or heart failure symptoms. Patient congratulated on her weight loss since last office visit. No known documentation of atrial fibrillation in the past. And patient did not undergo transesophageal echocardiogram during her recent hospitalization given 3 episodes of TIA in the past.  Review of Systems: .   Review of Systems   Cardiovascular:  Negative for chest pain, claudication, dyspnea on exertion, irregular heartbeat, leg swelling, near-syncope, orthopnea, palpitations, paroxysmal nocturnal dyspnea and syncope.  Respiratory:  Negative for shortness of breath.   Hematologic/Lymphatic: Negative for bleeding problem.  Musculoskeletal:  Negative for muscle cramps and myalgias.  Neurological:  Negative for dizziness and light-headedness.    Studies Reviewed:   CARDIAC DATABASE: EKG Interpretation Date/Time:  Wednesday October 02 2024 10:22:23 EST Ventricular Rate:  69 PR Interval:  166 QRS Duration:  76 QT Interval:  404 QTC Calculation: 432 R Axis:   43  Text Interpretation: Normal sinus rhythm Low voltage QRS When compared with ECG of 06-Nov-2022 21:46, No significant change since last tracing Confirmed by Large Madonna [phone]) on 10/02/2024 10:43:33 AM   Echocardiogram: 06/15/2023:  LVEF 60-65%. Normal diastolic filling pattern, normal LAP. Mild MR and AR.   09/20/2024  1. Left ventricular ejection fraction, by estimation, is 65 to 70%. The  left ventricle has normal function. The left ventricle has no regional  wall motion abnormalities. Left ventricular diastolic parameters are  consistent with Grade I diastolic  dysfunction (impaired relaxation).   2. Right ventricular systolic function is normal. The right ventricular  size is normal. Tricuspid regurgitation signal is inadequate for assessing  PA pressure.   3. The mitral valve is normal in structure. Mild mitral valve  regurgitation. No evidence of mitral stenosis.   4. The aortic valve is tricuspid. Aortic valve regurgitation is mild.  Aortic valve sclerosis is present, with no evidence of aortic valve  stenosis.   5. The inferior vena cava is normal in size with greater than  50%  respiratory variability, suggesting right atrial pressure of 3 mmHg.    Stress Testing: Exercise Myoview  stress test 06/08/2023: Exercise nuclear stress test  was performed using Bruce protocol.  1 Day Rest and Stress images. Exercise time 8 minutes 00 seconds on Bruce protocol, achieved 10.13 METS, 118% of APMHR. Stress ECG nondiagnostic for ischemia due to uninterpretable ECG due to lead/motion artifact. ST depressions in the inferolateral leads present 2 minutes into recovery. Normal myocardial perfusion in the presence of breast attenuation artifact.  No obvious evidence of reversible myocardial ischemia or prior infarct. Calculated LVEF 65%, visually appears hyperdynamic. Wall thickness is preserved, no obvious regional wall motion abnormalities. Low risk study.    CT Cardiac Scoring: 04/10/2023 Left main: 0 Left anterior descending artery: 276 Left circumflex artery: 31.4 Right coronary artery: 3.31   Total: 311 Percentile: 79th   Radiology over read: Mild right upper lobe atelectasis. Dilatation of the ascending aorta 3.9 cm  RADIOLOGY: NA  Risk Assessment/Calculations:   NA   Labs:       Latest Ref Rng & Units 09/19/2024    6:14 PM 09/19/2024    6:05 PM 11/08/2022    4:57 AM  CBC  WBC 4.0 - 10.5 K/uL  6.9  8.8   Hemoglobin 12.0 - 15.0 g/dL 84.9  85.7  87.4   Hematocrit 36.0 - 46.0 % 44.0  42.6  38.5   Platelets 150 - 400 K/uL  228  151        Latest Ref Rng & Units 09/19/2024    6:14 PM 09/19/2024    6:05 PM 11/08/2022    4:57 AM  BMP  Glucose 70 - 99 mg/dL 890  889  890   BUN 8 - 23 mg/dL 20  18  9    Creatinine 0.44 - 1.00 mg/dL 9.39  9.42  9.42   Sodium 135 - 145 mmol/L 136  135  139   Potassium 3.5 - 5.1 mmol/L 3.8  3.9  3.5   Chloride 98 - 111 mmol/L 98  98  110   CO2 22 - 32 mmol/L  25  24   Calcium  8.9 - 10.3 mg/dL  9.9  8.3       Latest Ref Rng & Units 09/19/2024    6:14 PM 09/19/2024    6:05 PM 11/08/2022    4:57 AM  CMP  Glucose 70 - 99 mg/dL 890  889  890   BUN 8 - 23 mg/dL 20  18  9    Creatinine 0.44 - 1.00 mg/dL 9.39  9.42  9.42   Sodium 135 - 145 mmol/L 136  135  139   Potassium 3.5 - 5.1  mmol/L 3.8  3.9  3.5   Chloride 98 - 111 mmol/L 98  98  110   CO2 22 - 32 mmol/L  25  24   Calcium  8.9 - 10.3 mg/dL  9.9  8.3   Total Protein 6.5 - 8.1 g/dL  7.3    Total Bilirubin 0.0 - 1.2 mg/dL  0.7    Alkaline Phos 38 - 126 U/L  71    AST 15 - 41 U/L  60    ALT 0 - 44 U/L  26      Lab Results  Component Value Date   CHOL 100 09/20/2024   HDL 48 09/20/2024   LDLCALC 41 09/20/2024   TRIG 54 09/20/2024   CHOLHDL 2.1 09/20/2024   No results for input(s): LIPOA in the last 8760  hours. No components found for: NTPROBNP No results for input(s): PROBNP in the last 8760 hours. No results for input(s): TSH in the last 8760 hours.  External Labs: Collected: 09/16/2022 provided by PCP. Total cholesterol 144, triglycerides 86, HDL 51, calculated LDL 76, non-HDL 93.  Collected: 07/17/2023 provided by patient performed at PCP. Total cholesterol 124, triglycerides 81, HDL 54, LDL calculated 54, non-HDL 70 [name], [place]  Comp Metabolic Panel Reviewed date:09/28/2024 09:11:59 AM Interpretation:Normal Performing Lab: Notes/Report: Testing performed at: [BN] [facility], [address], [place], [place], [address], Phone: [phone], Laboratory Director: Frankey Sas, MD  Glucose 85 70-99 mg/dL    BUN 10 1-72 mg/dL    Creatinine 9.43 9.42-8.99 mg/dL    eGFR 95 >40 fO/fpw/8.26    Sodium 140 134-144 mmol/L    Potassium 4.4 3.5-5.2 mmol/L    Chloride 101 96-106 mmol/L    Carbon Dioxide, Total 24 20-29 mmol/L    Calcium  9.9 8.7-10.3 mg/dL    CA-corrected 0.41 1.39-89.69 mg/dL    Protein, Total 6.6 3.9-1.4 g/dL    Albumin 4.4 6.1-5.1 g/dL    Bilirubin, Total 0.7 0.0-1.2 mg/dL    Alkaline Phosphatase 72 49-135 IU/L    AST (SGOT) 48 0-40 IU/L    ALT (SGPT) 20 0-32 IU/L    BUN/Creatinine Ratio 18 12-28    Globulin, Total 2.2 1.5-4.5 g/dL    TSH Reviewed ijuz:87/86/7974 09:11:59 AM Interpretation:in goal range Performing Lab: Notes/Report: Testing performed  at: [BN] [facility], [address], [place], [place], [address], Phone: [phone], Laboratory Director: Frankey Sas, MD  TSH 1.740 0.450-4.500 uIU/mL    Hemoglobin A1c Reviewed date:09/28/2024 09:11:59 AM Interpretation:prediabetes Performing Lab: Notes/Report: Testing performed at: [BN] [facility], [address] [place], [place], [address], Phone: [phone], Laboratory Director: Frankey Sas, MD  Estim. Avg Glu (eAG) 117      Hemoglobin A1c 5.7 4.8-5.6 % Prediabetes: 5.7 - 6.4  Diabetes: >6.4  Glycemic control for adults with diabetes: <7.0   Urine Dip without Micro, automated Reviewed date:11/11/2023 10:37:51 AM Interpretation: Performing Lab: Notes/Report:  SG 1.010 1.000 - 1.030    PH 6.0 5.0 - 9.0    Leuk Large Negative -    Nitrites Positive Negative -    Protein Negative Negative - mg/dL    Glucose Negative Negative - mg/dL    Ketones Negative Negative - mg/dL    UBG 0.2 0.2 - 1.0 mg/dL    BIL Negative Negative - mg/dL    Blood Moderate Negative - mg/dL    Appearance Yellow/Cloudy      Lipid Panel w/reflex Reviewed date:03/23/2024 10:17:13 AM Interpretation:In goal range Performing Lab: Notes/Report: Testing Performed at: [facility], [address], [place], [place] [address]  Cholesterol 122 <200 mg/dL    CHOL/HDL 2.2 7.9-5.9 Ratio    HDLD 56 30-85 mg/dL Values below 40 mg/dL indicate increased risk factor  Triglyceride 54 0-199 mg/dL    NHDL 66 9-870 mg/dL Range dependent upon risk factors.  LDL Chol Calc (NIH) 54 0-99 mg/dL    TSH Reviewed ijuz:93/92/7974 10:17:13 AM Interpretation:In goal range Performing Lab: Notes/Report: Testing Performed at: [facility], [address], [place], [place] [address]  TSH 1.66 0.34-4.50 UlU/mL      Physical Exam:    Today's Vitals   10/02/24 1020  BP: 135/73  Pulse: 68  Resp: 16  SpO2: 97%  Weight: 124 lb 9.6 oz (56.5 kg)  Height: 5' 1 (1.549 m)   Body mass  index is 23.54 kg/m.  Wt Readings from Last 3 Encounters:  10/02/24 124 lb 9.6 oz (56.5 kg)  09/19/24 125 lb (56.7 kg)  07/20/23 142 lb 6.4 oz (64.6 kg)    Physical Exam  Constitutional: No distress.  Age appropriate, hemodynamically stable.   Neck: No JVD present.  Cardiovascular: Normal rate, regular rhythm, S1 normal, S2 normal, intact distal pulses and normal pulses. Exam reveals no gallop, no S3 and no S4.  No murmur heard. Pulmonary/Chest: Effort normal and breath sounds normal. No stridor. She has no wheezes. She has no rales.  Abdominal: Soft. Bowel sounds are normal. She exhibits no distension. There is no abdominal tenderness.  Musculoskeletal:        General: No edema.     Cervical back: Neck supple.  Neurological: She is alert and oriented to person, place, and time. She has intact cranial nerves (2-12).  Skin: Skin is warm and moist.     Impression & Recommendation(s):  Impression:   ICD-10-CM   1. Elevated coronary artery calcium  score  R93.1 EKG 12-Lead    2. TIA (transient ischemic attack)  G45.9 VAS US  TRANSCRANIAL DOPPLER    ECHOCARDIOGRAM LIMITED BUBBLE STUDY    LONG TERM MONITOR (3-14 DAYS)    3. Benign hypertension  I10     4. Ascending aorta dilation  I77.810 CT CHEST WO CONTRAST    5. Mixed hyperlipidemia  E78.2     6. Former smoker  Z87.891        Recommendation(s):  Elevated coronary artery calcium  score Has undergone appropriate ischemic workup in the past. No anginal chest pain or heart failure symptoms. Currently on antiplatelet and lipid-lowering agents. LDL is very well-controlled at 41 mg/dL.  TIA (transient ischemic attack) Recently hospitalized in December 2025 for TIA Patient endorses having a total of 3 TIA episodes since 2015 Recommend 2-week monitor for evaluation of possible atrial fibrillation Patient did not undergo TEE for PFO evaluation.  Given her age less likely this is a contributing factor.  She also wants to avoid TEE  given her prior experience with EGD. For further risk stratification she is willing to undergo a limited echocardiogram with bubbles as well as transcranial Doppler. She plans to establish care with neurology outpatient. Continue antiplatelet therapy and lipid-lowering agents Given the fact that she has had 3 TIAs in the past my clinical concern is that she may have undiagnosed A-fib.  Is a cardiac monitor does not illustrate findings to suggest atrial fibrillation we could consider loop recorder implant for long-term monitoring.  Patient to consider this in the meantime.  She is well aware of atrial fibrillation as her husband is currently being treated for it.    Benign hypertension Office blood pressures are well-controlled. Currently on bisoprolol /hydrochlorothiazide  5/6.25 mg p.o. daily  Ascending aorta dilation Patient voiced her concerns about not being told she has an aortic aneurysm I reviewed her prior images and her coronary calcium  score did mention findings of dilated ascending aorta at 39 mm Reassurance was provided. I independently reviewed the recent echocardiogram and the study was not diagnostic to comment on the ascending aorta as it was not well-visualized. Given the patient's concerns shared decision was to proceed forward with CT chest without contrast for further evaluation  Mixed hyperlipidemia LDL 41 mg/dL, December 7974. Continue Lipitor 40 mg p.o. nightly  Orders Placed:  Orders Placed This Encounter  Procedures   CT CHEST WO CONTRAST    Standing Status:   Future    Expiration Date:  10/02/2025    [facility]   LONG TERM MONITOR (3-14 DAYS)    Standing Status:   Future    Number of Occurrences:   1    Expiration Date:   10/02/2025    Where should this test be performed?:   [facility]    Does the patient have an implanted cardiac device?:   No    Prescribed days of wear:   14    Type of enrollment:   Home  Enrollment    Vendor::   [employer]    Reason for Exam:   Other-Full Diagnosis List    Full ICD-10/Reason for Exam:   TIA (transient ischemic attack) [832385]   EKG 12-Lead   ECHOCARDIOGRAM LIMITED BUBBLE STUDY    Standing Status:   Future    Expiration Date:   10/02/2025    Where should this test [facility] performed:   [facility]    Does the patient weigh less than or greater than 250 lbs?:   Patient weighs less than 250 lbs    Perflutren DEFINITY (image enhancing agent) should be administered unless hypersensitivity or allergy exist:   Administer Perflutren    Reason for exam:   TIA (transient ischemic attack) 435.9 / G45.9    Final Medication List:   No orders of the defined types were placed in this encounter.   There are no discontinued medications.   Current Outpatient Medications:    acetaminophen  (TYLENOL ) 500 MG tablet, Take 2 tablets (1,000 mg total) by mouth every 6 (six) hours as needed. (Patient taking differently: Take 1,000 mg by mouth every 6 (six) hours as needed for mild pain (pain score 1-3) (or headaches).), Disp: 30 tablet, Rfl: 0   ARTIFICIAL TEARS 1 % ophthalmic solution, Place 1 drop into both eyes 3 (three) times daily as needed (for dryness)., Disp: , Rfl:    aspirin  EC 81 MG tablet, Take 1 tablet (81 mg total) by mouth daily for 21 days. Swallow whole., Disp: 21 tablet, Rfl: 0   atorvastatin  (LIPITOR) 40 MG tablet, Take 40 mg by mouth at bedtime., Disp: , Rfl:    bisoprolol -hydrochlorothiazide  (ZIAC ) 5-6.25 MG tablet, Take 1 tablet by mouth daily., Disp: , Rfl:    cholecalciferol (VITAMIN D3) 25 MCG (1000 UNIT) tablet, Take 1,000 Units by mouth daily., Disp: , Rfl:    clopidogrel  (PLAVIX ) 75 MG tablet, Take 1 tablet (75 mg total) by mouth every evening., Disp: , Rfl:    ESTRACE VAGINAL 0.1 MG/GM vaginal cream, Place 1 Applicatorful vaginally 2 (two) times a week., Disp: , Rfl:    famotidine  (PEPCID ) 20 MG tablet, Take 1 tablet (20 mg total) by mouth daily., Disp: 30  tablet, Rfl: 0   levothyroxine  (SYNTHROID ) 75 MCG tablet, Take 75-112.5 mcg by mouth See admin instructions. Take 75 mcg by mouth 30 minutes before breakfast on Sun/Mon/Tues/Wed/Thurs/Sat and 112.5 mcg on Fridays, Disp: , Rfl:    metFORMIN (GLUCOPHAGE-XR) 500 MG 24 hr tablet, Take 500 mg by mouth See admin instructions. Take 500 mg by mouth with breakfast and supper/evening meal, Disp: , Rfl:    Multiple Vitamins-Minerals (ICAPS AREDS 2 PO), Take 1 capsule by mouth 2 (two) times daily with a meal., Disp: , Rfl:   Consent:      NA  Disposition:   8-week follow-up sooner if needed  Her questions and concerns were addressed to her satisfaction. She voices understanding of the recommendations provided during this encounter.  Signed, Madonna Michele HAS, [facility]  A Division of [facility] [address], [place], [place] [address]   10/02/2024 1:25 PM

## 2024-10-02 NOTE — Patient Instructions (Addendum)
 Medication Instructions:  No Changes *If you need a refill on your cardiac medications before your next appointment, please call your pharmacy*  Lab Work: None  Testing/Procedures: (4 tests in total ordered today)  -Your physician has requested that you have a Limited Echocardiogram bubble study. Echocardiography is a painless test that uses sound waves to create images of your heart. It provides your doctor with information about the size and shape of your heart and how well your hearts chambers and valves are working. This procedure takes approximately one hour. There are no restrictions for this procedure. Please do NOT wear cologne, perfume, aftershave, or lotions (deodorant is allowed). Please arrive 15 minutes prior to your appointment time.  Please note: We ask at that you not bring children with you during ultrasound (echo/ vascular) testing. Due to room size and safety concerns, children are not allowed in the ultrasound rooms during exams. Our front office staff cannot provide observation of children in our lobby area while testing is being conducted. An adult accompanying a patient to their appointment will only be allowed in the ultrasound room at the discretion of the ultrasound technician under special circumstances. We apologize for any inconvenience.   -Transcranial Doppler (Ultrasound of Skull)  -CT of Chest without Contrast. Non-Cardiac CT scanning, (CAT scanning), is a noninvasive, special x-ray that produces cross-sectional images of the body using x-rays and a computer. CT scans help physicians diagnose and treat medical conditions. CT scans provide greater clarity and reveal more details than regular x-ray exams.    ZIO XT- Long Term Monitor Instructions  -Your physician has requested you wear a ZIO patch monitor for 14 days.  This is a single patch monitor. [employer] supplies one patch monitor per enrollment. Additional stickers are not available. Please do not apply  patch if you will be having a Nuclear Stress Test,  Echocardiogram, Cardiac CT, MRI, or Chest Xray during the period you would be wearing the  monitor. The patch cannot be worn during these tests. You cannot remove and re-apply the  ZIO XT patch monitor.  Your ZIO patch monitor will be mailed 3 day USPS to your address on file. It may take 3-5 days  to receive your monitor after you have been enrolled.  Once you have received your monitor, please review the enclosed instructions. Your monitor  has already been registered assigning a specific monitor serial # to you.  Billing and Patient Assistance Program Information  We have supplied [employer] with any of your insurance information on file for billing purposes. [employer] offers a sliding scale Patient Assistance Program for patients that do not have  insurance, or whose insurance does not completely cover the cost of the ZIO monitor.  You must apply for the Patient Assistance Program to qualify for this discounted rate.  To apply, please call [employer] at [phone], select option 4, select option 2, ask to apply for  Patient Assistance Program. Meredeth will ask your household income, and how many people  are in your household. They will quote your out-of-pocket cost based on that information.  [employer] will also be able to set up a 10-month, interest-free payment plan if needed.  Applying the monitor   Shave hair from upper left chest.  Hold abrader disc by orange tab. Rub abrader in 40 strokes over the upper left chest as  indicated in your monitor instructions.  Clean area with 4 enclosed alcohol  pads. Let dry.  Apply patch as indicated in monitor instructions. Patch will be  placed under collarbone on left  side of chest with arrow pointing upward.  Rub patch adhesive wings for 2 minutes. Remove white label marked 1. Remove the white  label marked 2. Rub patch adhesive wings for 2 additional minutes.  While looking in a mirror, press  and release button in center of patch. A small green light will  flash 3-4 times. This will be your only indicator that the monitor has been turned on.  Do not shower for the first 24 hours. You may shower after the first 24 hours.  Press the button if you feel a symptom. You will hear a small click. Record Date, Time and  Symptom in the Patient Logbook.  When you are ready to remove the patch, follow instructions on the last 2 pages of Patient  Logbook. Stick patch monitor onto the last page of Patient Logbook.  Place Patient Logbook in the blue and white box. Use locking tab on box and tape box closed  securely. The blue and white box has prepaid postage on it. Please place it in the mailbox as  soon as possible. Your physician should have your test results approximately 7 days after the  monitor has been mailed back to [facility].  Call [facility] Customer Care at [phone] if you have questions regarding  your ZIO XT patch monitor. Call them immediately if you see an orange light blinking on your  monitor.  If your monitor falls off in less than 4 days, contact our Monitor department at [phone].  If your monitor becomes loose or falls off after 4 days call [employer] at [phone] for  suggestions on securing your monitor   Follow-Up: At [facility], you and your health needs are our priority.  As part of our continuing mission to provide you with exceptional heart care, our providers are all part of one team.  This team includes your primary Cardiologist (physician) and Advanced Practice Providers or APPs (Physician Assistants and Nurse Practitioners) who all work together to provide you with the care you need, when you need it.  Your next appointment:   8 week(s) (Due around 11/28/2023; please make appointment at Check Out)  Provider:   Madonna Large, DO

## 2024-10-03 ENCOUNTER — Ambulatory Visit (HOSPITAL_COMMUNITY)
Admission: RE | Admit: 2024-10-03 | Discharge: 2024-10-03 | Disposition: A | Source: Ambulatory Visit | Attending: Cardiology

## 2024-10-03 DIAGNOSIS — G459 Transient cerebral ischemic attack, unspecified: Secondary | ICD-10-CM

## 2024-10-03 NOTE — Progress Notes (Signed)
 Transcranial doppler has been completed. Preliminary results can be found in CV Proc through chart review.   10/03/2024 11:39 AM Cathlyn Collet RVT

## 2024-10-07 ENCOUNTER — Ambulatory Visit (HOSPITAL_COMMUNITY)
Admission: RE | Admit: 2024-10-07 | Discharge: 2024-10-07 | Disposition: A | Discharge status: Home / Self Care | Source: Ambulatory Visit | Attending: Cardiology | Admitting: Cardiology

## 2024-10-07 DIAGNOSIS — I77810 Thoracic aortic ectasia: Secondary | ICD-10-CM | POA: Insufficient documentation

## 2024-10-23 NOTE — Addendum Note (Signed)
 Encounter addended by: Gerome Cordella PARAS, RVT on: 10/23/2024 11:04 AM  Actions taken: Imaging Exam ended

## 2024-10-25 ENCOUNTER — Ambulatory Visit: Payer: Self-pay | Admitting: Cardiology

## 2024-10-31 DIAGNOSIS — G459 Transient cerebral ischemic attack, unspecified: Secondary | ICD-10-CM | POA: Diagnosis not present

## 2024-11-07 ENCOUNTER — Ambulatory Visit
Admission: EM | Admit: 2024-11-07 | Discharge: 2024-11-07 | Disposition: A | Discharge status: Home / Self Care | Attending: Emergency Medicine | Admitting: Emergency Medicine

## 2024-11-07 DIAGNOSIS — N760 Acute vaginitis: Secondary | ICD-10-CM | POA: Insufficient documentation

## 2024-11-07 DIAGNOSIS — R300 Dysuria: Secondary | ICD-10-CM | POA: Diagnosis not present

## 2024-11-07 DIAGNOSIS — N3001 Acute cystitis with hematuria: Secondary | ICD-10-CM | POA: Insufficient documentation

## 2024-11-07 DIAGNOSIS — N898 Other specified noninflammatory disorders of vagina: Secondary | ICD-10-CM | POA: Insufficient documentation

## 2024-11-07 LAB — POCT URINALYSIS DIP (MANUAL ENTRY)
Bilirubin, UA: NEGATIVE
Glucose, UA: NEGATIVE mg/dL
Ketones, POC UA: NEGATIVE mg/dL
Nitrite, UA: POSITIVE — AB
Protein Ur, POC: 30 mg/dL — AB
Spec Grav, UA: 1.01
Urobilinogen, UA: 0.2 U/dL
pH, UA: 6

## 2024-11-07 MED ORDER — CEPHALEXIN 500 MG PO CAPS: 500.0000 mg | ORAL_CAPSULE | Freq: Two times a day (BID) | ORAL | 0 refills | Status: AC

## 2024-11-07 MED ORDER — FLUCONAZOLE 150 MG PO TABS: ORAL_TABLET | ORAL | 0 refills | Status: AC

## 2024-11-07 NOTE — Discharge Instructions (Signed)
 Start taking cephalexin  twice daily for 7 days for urinary tract infection. We have sent a urine culture and these results will return over the next few days and someone will call if results require any adjustment in treatment. Take 1 tablet of Diflucan  today to help with vaginal itching.  Take another tablet in 3 days if symptoms continue. Follow-up with your primary care provider or return here as needed.

## 2024-11-07 NOTE — ED Triage Notes (Signed)
 Pt c/o dysuria and urinary frequency x 1 week. Pos UTI home test earlier today.

## 2024-11-07 NOTE — ED Provider Notes (Signed)
 " Debra Herman    CSN: [id number] Arrival date & time: 11/07/24  9078      History   Chief Complaint Chief Complaint  Patient presents with   Dysuria    HPI Debra Herman is Herman 79 y.o. female.   Patient with dysuria and urinary frequency that began 1 week ago.  Patient reports that she did have some vaginal itching about 2 days after dysuria and urinary frequency began.  Patient reports that she did use Monistat with relief of vaginal itching, but then states that it returned yesterday and her dysuria and urinary frequency have worsened.  Patient denies any obvious hematuria, abdominal pain, flank pain, nausea, vomiting, and fever.  The history is provided by the patient and medical records.  Dysuria   Past Medical History:  Diagnosis Date   Coronary atherosclerosis due to calcified coronary lesion    Hyperlipidemia    Hypertension    TIA (transient ischemic attack)     Patient Active Problem List   Diagnosis Date Noted   TIA (transient ischemic attack) 09/19/2024   H/O Bell's palsy 09/19/2024   Essential hypertension 09/19/2024   Hypothyroidism 09/19/2024   History of TIAs 09/19/2024   HLD (hyperlipidemia) 09/19/2024   S/P exploratory laparotomy 11/07/2022    Past Surgical History:  Procedure Laterality Date   ABDOMINAL SURGERY     BREAST CYST ASPIRATION Right    ESOPHAGOGASTRODUODENOSCOPY (EGD) WITH PROPOFOL  Bilateral 03/01/2023   Procedure: ESOPHAGOGASTRODUODENOSCOPY (EGD) WITH PROPOFOL ;  Surgeon: Burnette Fallow, MD;  Location: [facility];  Service: Gastroenterology;  Laterality: Bilateral;   FINE NEEDLE ASPIRATION N/Herman 03/01/2023   Procedure: FINE NEEDLE ASPIRATION (FNA) LINEAR;  Surgeon: Burnette Fallow, MD;  Location: [facility];  Service: Gastroenterology;  Laterality: N/Herman;   LAPAROTOMY N/Herman 11/07/2022   Procedure: EXPLORATORY LAPAROTOMY LYSIS OF ADHESIONS;  Surgeon: Ebbie Cough, MD;  Location: [facility];  Service: General;  Laterality:  N/Herman;   UPPER ESOPHAGEAL ENDOSCOPIC ULTRASOUND (EUS) Bilateral 03/01/2023   Procedure: UPPER ESOPHAGEAL ENDOSCOPIC ULTRASOUND (EUS);  Surgeon: Burnette Fallow, MD;  Location: [facility];  Service: Gastroenterology;  Laterality: Bilateral;    OB History   No obstetric history on file.      Home Medications    Prior to Admission medications  Medication Sig Start Date End Date Taking? Authorizing Provider  cephALEXin  (KEFLEX ) 500 MG capsule Take 1 capsule (500 mg total) by mouth 2 (two) times daily for 7 days. 11/07/24 11/14/24 Yes Debra Flaming Herman, Debra Herman  fluconazole  (DIFLUCAN ) 150 MG tablet Take one tablet today and one tablet in 3 days if symptoms persist. 11/07/24  Yes Debra Flaming Herman, Debra Herman  acetaminophen  (TYLENOL ) 500 MG tablet Take 2 tablets (1,000 mg total) by mouth every 6 (six) hours as needed. Patient taking differently: Take 1,000 mg by mouth every 6 (six) hours as needed for mild pain (pain score 1-3) (or headaches). 11/10/22   Tammy Sor, PA-C  ARTIFICIAL TEARS 1 % ophthalmic solution Place 1 drop into both eyes 3 (three) times daily as needed (for dryness).    [provider]  atorvastatin  (LIPITOR) 40 MG tablet Take 40 mg by mouth at bedtime.    [provider]  bisoprolol -hydrochlorothiazide  (ZIAC ) 5-6.25 MG tablet Take 1 tablet by mouth daily.    [provider]  cholecalciferol (VITAMIN D3) 25 MCG (1000 UNIT) tablet Take 1,000 Units by mouth daily.    [provider]  clopidogrel  (PLAVIX ) 75 MG tablet Take 1 tablet (75 mg total) by mouth every  evening. 03/03/23   Burnette Fallow, MD  ESTRACE VAGINAL 0.1 MG/GM vaginal cream Place 1 Applicatorful vaginally 2 (two) times Herman week.    [provider]  famotidine  (PEPCID ) 20 MG tablet Take 1 tablet (20 mg total) by mouth daily. 09/21/24 10/21/24  Singh, Prashant K, MD  levothyroxine  (SYNTHROID ) 75 MCG tablet Take 75-112.5 mcg by mouth See admin instructions. Take 75 mcg by mouth 30 minutes  before breakfast on Sun/Mon/Tues/Wed/Thurs/Sat and 112.5 mcg on Fridays 04/16/13   [provider]  metFORMIN (GLUCOPHAGE-XR) 500 MG 24 hr tablet Take 500 mg by mouth See admin instructions. Take 500 mg by mouth with breakfast and supper/evening meal    [provider]  Multiple Vitamins-Minerals (ICAPS AREDS 2 PO) Take 1 capsule by mouth 2 (two) times daily with Herman meal.    [provider]    Family History Family History  Problem Relation Age of Onset   Lung cancer Mother    Heart disease Father    Alcoholism Father    Liver disease Father    Multiple sclerosis Sister    Heart attack Brother    Breast cancer Neg Hx     Social History Social History[1]   Allergies   Latex, Tape, Gadolinium derivatives, Gadopiclenol , Iodinated contrast media, and Sulfa antibiotics   Review of Systems Review of Systems  Genitourinary:  Positive for dysuria.   Per HPI  Physical Exam Triage Vital Signs ED Triage Vitals [11/07/24 0955]  Encounter Vitals Group     BP 128/73     Girls Systolic BP Percentile      Girls Diastolic BP Percentile      Boys Systolic BP Percentile      Boys Diastolic BP Percentile      Pulse Rate 68     Resp 17     Temp 98.2 F (36.8 C)     Temp Source Oral     SpO2 96 %     Weight      Height      Head Circumference      Peak Flow      Pain Score 0     Pain Loc      Pain Education      Exclude from Growth Chart    No data found.  Updated Vital Signs BP 128/73 (BP Location: Right Arm)   Pulse 68   Temp 98.2 F (36.8 C) (Oral)   Resp 17   LMP  (LMP Unknown)   SpO2 96%   Visual Acuity Right Eye Distance:   Left Eye Distance:   Bilateral Distance:    Right Eye Near:   Left Eye Near:    Bilateral Near:     Physical Exam Vitals and nursing note reviewed.  Constitutional:      General: She is awake. She is not in acute distress.    Appearance: Normal appearance. She is well-developed and well-groomed. She is not  ill-appearing.  Abdominal:     General: Abdomen is flat. Bowel sounds are normal. There is no distension.     Palpations: Abdomen is soft.     Tenderness: There is no abdominal tenderness. There is no right CVA tenderness, left CVA tenderness, guarding or rebound.     Hernia: No hernia is present.  Genitourinary:    Comments: Exam deferred Skin:    General: Skin is warm and dry.  Neurological:     General: No focal deficit present.     Mental Status: She  is alert and oriented to person, place, and time. Mental status is at baseline.  Psychiatric:        Behavior: Behavior is cooperative.      UC Treatments / Results  Labs (all labs ordered are listed, but only abnormal results are displayed) Labs Reviewed  POCT URINALYSIS DIP (MANUAL ENTRY) - Abnormal; Notable for the following components:      Result Value   Clarity, UA cloudy (*)    Blood, UA moderate (*)    Protein Ur, POC =30 (*)    Nitrite, UA Positive (*)    Leukocytes, UA Large (3+) (*)    All other components within normal limits  URINE CULTURE    EKG   Radiology No results found.  Procedures Procedures (including critical Herman time)  Medications Ordered in UC Medications - No data to display  Initial Impression / Assessment and Plan / UC Course  I have reviewed the triage vital signs and the nursing notes.  Pertinent labs & imaging results that were available during my Herman of the patient were reviewed by me and considered in my medical decision making (see chart for details).     Patient is overall well-appearing.  Vitals are stable.  No significant findings noted on exam.  Urinalysis revealed large leukocytes, positive nitrates, and moderate RBCs, will send urine culture to confirm presence of urinary tract infection.  Empirically treating for acute cystitis with cephalexin .  Empirically treating for yeast vaginitis with Diflucan .  Discussed follow-up and return precautions. Final Clinical  Impressions(s) / UC Diagnoses   Final diagnoses:  Dysuria  Vaginal itching  Acute cystitis with hematuria  Acute vaginitis     Discharge Instructions      Start taking cephalexin  twice daily for 7 days for urinary tract infection. We have sent Herman urine culture and these results will return over the next few days and someone will call if results require any adjustment in treatment. Take 1 tablet of Diflucan  today to help with vaginal itching.  Take another tablet in 3 days if symptoms continue. Follow-up with your primary Herman provider or return here as needed.   ED Prescriptions     Medication Sig Dispense Auth. Provider   cephALEXin  (KEFLEX ) 500 MG capsule Take 1 capsule (500 mg total) by mouth 2 (two) times daily for 7 days. 14 capsule Debra, Debra Janak Herman, Debra Herman   fluconazole  (DIFLUCAN ) 150 MG tablet Take one tablet today and one tablet in 3 days if symptoms persist. 2 tablet Debra Flaming Herman, Debra Herman      PDMP not reviewed this encounter.    [1]  Social History Tobacco Use   Smoking status: Former    Current packs/day: 0.00    Types: Cigarettes    Quit date: 1981    Years since quitting: 45.0   Smokeless tobacco: Never  Vaping Use   Vaping status: Never Used  Substance Use Topics   Alcohol  use: Yes    Alcohol /week: 2.0 standard drinks of alcohol     Types: 1 Glasses of wine, 1 Cans of beer per week    Comment: rarely   Drug use: Not Currently    Types: Marijuana, LSD    Comment: when she was in her 20's     Debra Flaming LABOR, Debra Herman 11/07/24 1033  "

## 2024-11-09 LAB — URINE CULTURE: Culture: >=100000 — AB

## 2024-11-12 ENCOUNTER — Ambulatory Visit (HOSPITAL_COMMUNITY): Payer: Self-pay

## 2024-11-13 ENCOUNTER — Ambulatory Visit (HOSPITAL_COMMUNITY)

## 2024-11-13 NOTE — Telephone Encounter (Signed)
 Spoke with patient. Relayed Dr. Tyree note. All concerns addressed. Rescheduled patient to 3/31.

## 2024-11-13 NOTE — Telephone Encounter (Signed)
-----   Message from [place], [place] sent at 10/25/2024  7:25 PM EST ----- #1.  Dimension of the ascending aorta remains stable, consistent with ectasia. #2 coronary artery calcification, will discuss at the next office visit. #3 small bilateral pulmonary nodules measuring up to 5 mm, please have her discuss this further with PCP.  Would favor rechecking CT of the chest without contrast in 1 year given her history of  smoking.  We can discuss this further at the next visit.  Please move her appointment after the echocardiogram is complete to discuss results.  Please send a copy to her PCP for reference and to help coordinate care  Dr. Michele

## 2024-12-04 ENCOUNTER — Ambulatory Visit: Admitting: Cardiology

## 2024-12-16 ENCOUNTER — Ambulatory Visit (HOSPITAL_COMMUNITY)

## 2024-12-30 ENCOUNTER — Ambulatory Visit: Admitting: Diagnostic Neuroimaging

## 2025-01-14 ENCOUNTER — Ambulatory Visit: Admitting: Cardiology
